# Patient Record
Sex: Male | Born: 1945 | Race: White | Marital: Married | Smoking: Former smoker
Health system: Southern US, Community
[De-identification: ages and names within clinical notes are randomized; demographics above are authoritative.]

## PROBLEM LIST (undated history)

## (undated) DIAGNOSIS — R0600 Dyspnea, unspecified: Secondary | ICD-10-CM

## (undated) DIAGNOSIS — R519 Headache, unspecified: Secondary | ICD-10-CM

## (undated) DIAGNOSIS — J45909 Unspecified asthma, uncomplicated: Secondary | ICD-10-CM

## (undated) DIAGNOSIS — E119 Type 2 diabetes mellitus without complications: Secondary | ICD-10-CM

## (undated) DIAGNOSIS — E039 Hypothyroidism, unspecified: Secondary | ICD-10-CM

## (undated) DIAGNOSIS — M1990 Unspecified osteoarthritis, unspecified site: Secondary | ICD-10-CM

## (undated) DIAGNOSIS — I10 Essential (primary) hypertension: Secondary | ICD-10-CM

## (undated) DIAGNOSIS — I639 Cerebral infarction, unspecified: Secondary | ICD-10-CM

## (undated) HISTORY — PX: TONSILLECTOMY: SUR1361

## (undated) HISTORY — PX: PITUITARY SURGERY: SHX203

---

## 2014-05-14 DEATH — deceased

## 2022-02-23 ENCOUNTER — Other Ambulatory Visit: Payer: Self-pay | Admitting: Physician Assistant

## 2022-02-23 ENCOUNTER — Ambulatory Visit
Admission: RE | Admit: 2022-02-23 | Discharge: 2022-02-23 | Disposition: A | Discharge status: Home / Self Care | Payer: Medicare Other | Source: Ambulatory Visit | Attending: Physician Assistant | Admitting: Physician Assistant

## 2022-02-23 DIAGNOSIS — M7989 Other specified soft tissue disorders: Secondary | ICD-10-CM | POA: Diagnosis present

## 2022-06-01 ENCOUNTER — Other Ambulatory Visit: Payer: Self-pay | Admitting: Family Medicine

## 2022-06-01 DIAGNOSIS — M542 Cervicalgia: Secondary | ICD-10-CM

## 2022-06-01 DIAGNOSIS — M5412 Radiculopathy, cervical region: Secondary | ICD-10-CM

## 2022-06-08 ENCOUNTER — Ambulatory Visit
Admission: RE | Admit: 2022-06-08 | Discharge: 2022-06-08 | Disposition: A | Discharge status: Home / Self Care | Payer: Medicare Other | Source: Ambulatory Visit | Attending: Family Medicine | Admitting: Family Medicine

## 2022-06-08 DIAGNOSIS — M5412 Radiculopathy, cervical region: Secondary | ICD-10-CM

## 2022-06-14 ENCOUNTER — Other Ambulatory Visit: Payer: Self-pay | Admitting: Family Medicine

## 2022-06-14 DIAGNOSIS — D352 Benign neoplasm of pituitary gland: Secondary | ICD-10-CM

## 2022-06-21 ENCOUNTER — Other Ambulatory Visit: Payer: Medicare Other

## 2022-06-22 ENCOUNTER — Ambulatory Visit
Admission: RE | Admit: 2022-06-22 | Discharge: 2022-06-22 | Disposition: A | Discharge status: Home / Self Care | Payer: Medicare Other | Source: Ambulatory Visit | Attending: Family Medicine | Admitting: Family Medicine

## 2022-06-22 DIAGNOSIS — D352 Benign neoplasm of pituitary gland: Secondary | ICD-10-CM | POA: Insufficient documentation

## 2022-06-22 MED ORDER — GADOBUTROL 1 MMOL/ML IV SOLN
9.0000 mL | Freq: Once | INTRAVENOUS | Status: AC | PRN
  Administered 2022-06-22: 9 mL via INTRAVENOUS

## 2022-08-03 ENCOUNTER — Other Ambulatory Visit: Payer: Self-pay

## 2022-08-03 ENCOUNTER — Encounter
Admission: RE | Disposition: A | Discharge status: Home / Self Care | Payer: Self-pay | Source: Home / Self Care | Attending: Internal Medicine

## 2022-08-03 ENCOUNTER — Encounter: Payer: Self-pay | Admitting: Internal Medicine

## 2022-08-03 ENCOUNTER — Ambulatory Visit
Admission: RE | Admit: 2022-08-03 | Discharge: 2022-08-03 | Disposition: A | Discharge status: Home / Self Care | Payer: Medicare Other | Attending: Internal Medicine | Admitting: Internal Medicine

## 2022-08-03 ENCOUNTER — Ambulatory Visit
Admission: RE | Admit: 2022-08-03 | Discharge: 2022-08-03 | Disposition: A | Discharge status: Home / Self Care | Payer: Medicare Other | Source: Home / Self Care | Attending: Internal Medicine | Admitting: Internal Medicine

## 2022-08-03 DIAGNOSIS — I639 Cerebral infarction, unspecified: Secondary | ICD-10-CM | POA: Diagnosis not present

## 2022-08-03 DIAGNOSIS — I059 Rheumatic mitral valve disease, unspecified: Secondary | ICD-10-CM

## 2022-08-03 HISTORY — DX: Unspecified osteoarthritis, unspecified site: M19.90

## 2022-08-03 HISTORY — PX: TEE WITHOUT CARDIOVERSION: SHX5443

## 2022-08-03 HISTORY — DX: Type 2 diabetes mellitus without complications: E11.9

## 2022-08-03 HISTORY — DX: Cerebral infarction, unspecified: I63.9

## 2022-08-03 HISTORY — DX: Unspecified asthma, uncomplicated: J45.909

## 2022-08-03 HISTORY — DX: Essential (primary) hypertension: I10

## 2022-08-03 HISTORY — DX: Headache, unspecified: R51.9

## 2022-08-03 HISTORY — DX: Dyspnea, unspecified: R06.00

## 2022-08-03 HISTORY — DX: Hypothyroidism, unspecified: E03.9

## 2022-08-03 SURGERY — ECHOCARDIOGRAM, TRANSESOPHAGEAL
Anesthesia: Moderate Sedation

## 2022-08-03 MED ORDER — SODIUM CHLORIDE 0.9 % IV SOLN: INTRAVENOUS | Status: DC

## 2022-08-03 MED ORDER — MIDAZOLAM HCL 2 MG/2ML IJ SOLN
INTRAMUSCULAR | Status: AC
  Filled 2022-08-03: qty 4

## 2022-08-03 MED ORDER — BUTAMBEN-TETRACAINE-BENZOCAINE 2-2-14 % EX AERO
INHALATION_SPRAY | CUTANEOUS | Status: AC
  Filled 2022-08-03: qty 5

## 2022-08-03 MED ORDER — SODIUM CHLORIDE FLUSH 0.9 % IV SOLN
INTRAVENOUS | Status: AC
  Filled 2022-08-03: qty 10

## 2022-08-03 MED ORDER — LIDOCAINE VISCOUS HCL 2 % MT SOLN
OROMUCOSAL | Status: AC
  Filled 2022-08-03: qty 15

## 2022-08-03 MED ORDER — FENTANYL CITRATE (PF) 100 MCG/2ML IJ SOLN
INTRAMUSCULAR | Status: AC
  Filled 2022-08-03: qty 2

## 2022-08-03 MED ORDER — FENTANYL CITRATE (PF) 100 MCG/2ML IJ SOLN
INTRAMUSCULAR | Status: AC | PRN
  Administered 2022-08-03: 50 ug via INTRAVENOUS

## 2022-08-03 MED ORDER — MIDAZOLAM HCL 2 MG/2ML IJ SOLN
INTRAMUSCULAR | Status: AC | PRN
  Administered 2022-08-03: 2 mg via INTRAVENOUS

## 2022-08-03 NOTE — CV Procedure (Signed)
Transesophageal echocardiogram preliminary report  Samuel Anthony [id number] 03/07/1946  Preliminary diagnosis  Stroke with possible embolic source  Postprocedural diagnosis  Normal LV systolic function no apparent source of embolus  Time out A timeout was performed by the nursing staff and physicians specifically identifying the procedure performed, identification of the patient, the type of sedation, all allergies and medications, all pertinent medical history, and presedation assessment of nasopharynx. The patient and or family understand the risks of the procedure including the rare risks of death, stroke, heart attack, esophogeal perforation, sore throat, and reaction to medications given.  Moderate sedation During this procedure the patient has received Versed 2 milligrams and fentanyl 50 micrograms to achieve appropriate moderate sedation.  The patient had continued monitoring of heart rate, oxygenation, blood pressure, respiratory rate, and extent of signs of sedation throughout the entire procedure.  The patient received this moderate sedation over a period of 12 minutes.  Both the nursing staff and I were present during the procedure when the patient had moderate sedation for 100% of the time.  Treatment considerations  No further cardiac intervention due to no apparent cardiac source of embolus  For further details of transesophageal echocardiogram please refer to final report.  Signed,  Corey Skains M.D. [facility] 08/03/2022 8:18 AM

## 2022-08-03 NOTE — Progress Notes (Signed)
*  PRELIMINARY RESULTS* Echocardiogram Echocardiogram Transesophageal has been performed.  Sherrie Sport 08/03/2022, 8:25 AM

## 2022-08-04 ENCOUNTER — Encounter: Payer: Self-pay | Admitting: Internal Medicine

## 2022-10-02 ENCOUNTER — Emergency Department: Payer: Medicare Other

## 2022-10-02 ENCOUNTER — Other Ambulatory Visit: Payer: Self-pay

## 2022-10-02 ENCOUNTER — Inpatient Hospital Stay
Admission: EM | Admit: 2022-10-02 | Discharge: 2022-10-05 | DRG: 193 | Disposition: A | Discharge status: Home / Self Care | Payer: Medicare Other | Attending: Internal Medicine | Admitting: Internal Medicine

## 2022-10-02 DIAGNOSIS — R0902 Hypoxemia: Principal | ICD-10-CM

## 2022-10-02 DIAGNOSIS — Z1152 Encounter for screening for COVID-19: Secondary | ICD-10-CM | POA: Diagnosis not present

## 2022-10-02 DIAGNOSIS — Z833 Family history of diabetes mellitus: Secondary | ICD-10-CM | POA: Diagnosis not present

## 2022-10-02 DIAGNOSIS — J210 Acute bronchiolitis due to respiratory syncytial virus: Secondary | ICD-10-CM | POA: Diagnosis present

## 2022-10-02 DIAGNOSIS — M109 Gout, unspecified: Secondary | ICD-10-CM | POA: Diagnosis present

## 2022-10-02 DIAGNOSIS — J9601 Acute respiratory failure with hypoxia: Secondary | ICD-10-CM | POA: Diagnosis present

## 2022-10-02 DIAGNOSIS — I10 Essential (primary) hypertension: Secondary | ICD-10-CM | POA: Diagnosis present

## 2022-10-02 DIAGNOSIS — J189 Pneumonia, unspecified organism: Secondary | ICD-10-CM | POA: Diagnosis not present

## 2022-10-02 DIAGNOSIS — Z8673 Personal history of transient ischemic attack (TIA), and cerebral infarction without residual deficits: Secondary | ICD-10-CM

## 2022-10-02 DIAGNOSIS — J45909 Unspecified asthma, uncomplicated: Secondary | ICD-10-CM | POA: Diagnosis present

## 2022-10-02 DIAGNOSIS — E871 Hypo-osmolality and hyponatremia: Secondary | ICD-10-CM | POA: Diagnosis present

## 2022-10-02 DIAGNOSIS — E039 Hypothyroidism, unspecified: Secondary | ICD-10-CM | POA: Diagnosis present

## 2022-10-02 DIAGNOSIS — R0602 Shortness of breath: Secondary | ICD-10-CM

## 2022-10-02 DIAGNOSIS — E1142 Type 2 diabetes mellitus with diabetic polyneuropathy: Secondary | ICD-10-CM | POA: Diagnosis present

## 2022-10-02 DIAGNOSIS — N179 Acute kidney failure, unspecified: Secondary | ICD-10-CM

## 2022-10-02 DIAGNOSIS — G4733 Obstructive sleep apnea (adult) (pediatric): Secondary | ICD-10-CM | POA: Diagnosis present

## 2022-10-02 DIAGNOSIS — J121 Respiratory syncytial virus pneumonia: Principal | ICD-10-CM | POA: Diagnosis present

## 2022-10-02 DIAGNOSIS — Z6828 Body mass index (BMI) 28.0-28.9, adult: Secondary | ICD-10-CM

## 2022-10-02 DIAGNOSIS — J45901 Unspecified asthma with (acute) exacerbation: Secondary | ICD-10-CM | POA: Diagnosis not present

## 2022-10-02 DIAGNOSIS — Z87891 Personal history of nicotine dependence: Secondary | ICD-10-CM | POA: Diagnosis not present

## 2022-10-02 DIAGNOSIS — J219 Acute bronchiolitis, unspecified: Secondary | ICD-10-CM

## 2022-10-02 DIAGNOSIS — E861 Hypovolemia: Secondary | ICD-10-CM | POA: Diagnosis present

## 2022-10-02 DIAGNOSIS — E663 Overweight: Secondary | ICD-10-CM | POA: Diagnosis present

## 2022-10-02 LAB — COMPREHENSIVE METABOLIC PANEL
ALT: 52 U/L — ABNORMAL HIGH (ref 0–44)
AST: 38 U/L (ref 15–41)
Albumin: 4.5 g/dL (ref 3.5–5.0)
Alkaline Phosphatase: 97 U/L (ref 38–126)
Anion gap: 11 (ref 5–15)
BUN: 36 mg/dL — ABNORMAL HIGH (ref 8–23)
CO2: 27 mmol/L (ref 22–32)
Calcium: 9.1 mg/dL (ref 8.9–10.3)
Chloride: 92 mmol/L — ABNORMAL LOW (ref 98–111)
Creatinine, Ser: 1.43 mg/dL — ABNORMAL HIGH (ref 0.61–1.24)
GFR, Estimated: 51 mL/min — ABNORMAL LOW (ref >60)
Glucose, Bld: 141 mg/dL — ABNORMAL HIGH (ref 70–99)
Potassium: 4.1 mmol/L (ref 3.5–5.1)
Sodium: 130 mmol/L — ABNORMAL LOW (ref 135–145)
Total Bilirubin: 1.1 mg/dL (ref 0.3–1.2)
Total Protein: 7.7 g/dL (ref 6.5–8.1)

## 2022-10-02 LAB — CBC WITH DIFFERENTIAL/PLATELET
Abs Immature Granulocytes: 0.12 10*3/uL — ABNORMAL HIGH (ref 0.00–0.07)
Basophils Absolute: 0.1 10*3/uL (ref 0.0–0.1)
Basophils Relative: 1 %
Eosinophils Absolute: 0.1 10*3/uL (ref 0.0–0.5)
Eosinophils Relative: 1 %
HCT: 44.8 % (ref 39.0–52.0)
Hemoglobin: 16.2 g/dL (ref 13.0–17.0)
Immature Granulocytes: 1 %
Lymphocytes Relative: 10 %
Lymphs Abs: 1 10*3/uL (ref 0.7–4.0)
MCH: 31.5 pg (ref 26.0–34.0)
MCHC: 36.2 g/dL — ABNORMAL HIGH (ref 30.0–36.0)
MCV: 87.2 fL (ref 80.0–100.0)
Monocytes Absolute: 0.5 10*3/uL (ref 0.1–1.0)
Monocytes Relative: 5 %
Neutro Abs: 8.3 10*3/uL — ABNORMAL HIGH (ref 1.7–7.7)
Neutrophils Relative %: 82 %
Platelets: 237 10*3/uL (ref 150–400)
RBC: 5.14 MIL/uL (ref 4.22–5.81)
RDW: 11.6 % (ref 11.5–15.5)
WBC: 10.1 10*3/uL (ref 4.0–10.5)
nRBC: 0 % (ref 0.0–0.2)

## 2022-10-02 LAB — LACTIC ACID, PLASMA: Lactic Acid, Venous: 0.9 mmol/L (ref 0.5–1.9)

## 2022-10-02 LAB — RESP PANEL BY RT-PCR (FLU A&B, COVID) ARPGX2
Influenza A by PCR: NEGATIVE
Influenza B by PCR: NEGATIVE
SARS Coronavirus 2 by RT PCR: NEGATIVE

## 2022-10-02 LAB — PROTIME-INR
INR: 1.1 (ref 0.8–1.2)
Prothrombin Time: 14.2 seconds (ref 11.4–15.2)

## 2022-10-02 MED ORDER — SODIUM CHLORIDE 0.9 % IV SOLN
2.0000 g | INTRAVENOUS | Status: DC
  Administered 2022-10-03: 2 g via INTRAVENOUS
  Filled 2022-10-02: qty 20

## 2022-10-02 MED ORDER — MAGNESIUM HYDROXIDE 400 MG/5ML PO SUSP: 30.0000 mL | Freq: Every day | ORAL | Status: DC | PRN

## 2022-10-02 MED ORDER — SODIUM CHLORIDE 0.9 % IV SOLN
500.0000 mg | INTRAVENOUS | Status: DC
  Administered 2022-10-03: 500 mg via INTRAVENOUS
  Filled 2022-10-02: qty 5

## 2022-10-02 MED ORDER — IOHEXOL 350 MG/ML SOLN
75.0000 mL | Freq: Once | INTRAVENOUS | Status: AC | PRN
  Administered 2022-10-02: 75 mL via INTRAVENOUS

## 2022-10-02 MED ORDER — ACETAMINOPHEN 650 MG RE SUPP: 650.0000 mg | Freq: Four times a day (QID) | RECTAL | Status: DC | PRN

## 2022-10-02 MED ORDER — HYDROCOD POLI-CHLORPHE POLI ER 10-8 MG/5ML PO SUER: 5.0000 mL | Freq: Two times a day (BID) | ORAL | Status: DC | PRN

## 2022-10-02 MED ORDER — PREDNISONE 20 MG PO TABS
40.0000 mg | ORAL_TABLET | Freq: Every day | ORAL | Status: DC
  Administered 2022-10-04 – 2022-10-05 (×2): 40 mg via ORAL
  Filled 2022-10-02 (×2): qty 2

## 2022-10-02 MED ORDER — IPRATROPIUM-ALBUTEROL 0.5-2.5 (3) MG/3ML IN SOLN
3.0000 mL | Freq: Once | RESPIRATORY_TRACT | Status: AC
  Administered 2022-10-02: 3 mL via RESPIRATORY_TRACT
  Filled 2022-10-02: qty 3

## 2022-10-02 MED ORDER — ADULT MULTIVITAMIN W/MINERALS CH
1.0000 | ORAL_TABLET | Freq: Every day | ORAL | Status: DC
  Administered 2022-10-03 – 2022-10-05 (×3): 1 via ORAL
  Filled 2022-10-02 (×3): qty 1

## 2022-10-02 MED ORDER — EMPAGLIFLOZIN 10 MG PO TABS
10.0000 mg | ORAL_TABLET | Freq: Every day | ORAL | Status: DC
  Administered 2022-10-03 – 2022-10-05 (×3): 10 mg via ORAL
  Filled 2022-10-02 (×3): qty 1

## 2022-10-02 MED ORDER — CLOPIDOGREL BISULFATE 75 MG PO TABS
75.0000 mg | ORAL_TABLET | Freq: Every day | ORAL | Status: DC
  Administered 2022-10-03 – 2022-10-05 (×3): 75 mg via ORAL
  Filled 2022-10-02 (×3): qty 1

## 2022-10-02 MED ORDER — AMLODIPINE BESYLATE 10 MG PO TABS
10.0000 mg | ORAL_TABLET | Freq: Every day | ORAL | Status: DC
  Administered 2022-10-03 – 2022-10-05 (×3): 10 mg via ORAL
  Filled 2022-10-02: qty 2
  Filled 2022-10-02 (×2): qty 1

## 2022-10-02 MED ORDER — ALBUTEROL SULFATE HFA 108 (90 BASE) MCG/ACT IN AERS: 1.0000 | INHALATION_SPRAY | Freq: Four times a day (QID) | RESPIRATORY_TRACT | Status: DC | PRN

## 2022-10-02 MED ORDER — CARVEDILOL 12.5 MG PO TABS
12.5000 mg | ORAL_TABLET | Freq: Two times a day (BID) | ORAL | Status: DC
  Administered 2022-10-03 – 2022-10-05 (×5): 12.5 mg via ORAL
  Filled 2022-10-02: qty 1
  Filled 2022-10-02: qty 2
  Filled 2022-10-02 (×3): qty 1

## 2022-10-02 MED ORDER — ONDANSETRON HCL 4 MG/2ML IJ SOLN: 4.0000 mg | Freq: Four times a day (QID) | INTRAMUSCULAR | Status: DC | PRN

## 2022-10-02 MED ORDER — ONDANSETRON HCL 4 MG PO TABS: 4.0000 mg | ORAL_TABLET | Freq: Four times a day (QID) | ORAL | Status: DC | PRN

## 2022-10-02 MED ORDER — LEVOTHYROXINE SODIUM 100 MCG PO TABS
100.0000 ug | ORAL_TABLET | Freq: Every day | ORAL | Status: DC
  Administered 2022-10-03 – 2022-10-05 (×3): 100 ug via ORAL
  Filled 2022-10-02 (×2): qty 1
  Filled 2022-10-02: qty 2
  Filled 2022-10-02: qty 1

## 2022-10-02 MED ORDER — BENZONATATE 100 MG PO CAPS: 200.0000 mg | ORAL_CAPSULE | ORAL | Status: DC | PRN

## 2022-10-02 MED ORDER — GUAIFENESIN ER 600 MG PO TB12
600.0000 mg | ORAL_TABLET | Freq: Two times a day (BID) | ORAL | Status: DC
  Administered 2022-10-03 – 2022-10-05 (×6): 600 mg via ORAL
  Filled 2022-10-02 (×6): qty 1

## 2022-10-02 MED ORDER — OMEGA-3-ACID ETHYL ESTERS 1 G PO CAPS
1.0000 | ORAL_CAPSULE | Freq: Every day | ORAL | Status: DC
  Administered 2022-10-03 – 2022-10-05 (×3): 1 g via ORAL
  Filled 2022-10-02 (×3): qty 1

## 2022-10-02 MED ORDER — ALFUZOSIN HCL ER 10 MG PO TB24
10.0000 mg | ORAL_TABLET | Freq: Every day | ORAL | Status: DC
  Administered 2022-10-03 – 2022-10-05 (×3): 10 mg via ORAL
  Filled 2022-10-02 (×3): qty 1

## 2022-10-02 MED ORDER — ACETAMINOPHEN 325 MG PO TABS: 650.0000 mg | ORAL_TABLET | Freq: Four times a day (QID) | ORAL | Status: DC | PRN

## 2022-10-02 MED ORDER — ACETYLCYSTEINE 20 % IN SOLN
4.0000 mL | Freq: Once | RESPIRATORY_TRACT | Status: AC
  Administered 2022-10-02: 4 mL via RESPIRATORY_TRACT
  Filled 2022-10-02: qty 4

## 2022-10-02 MED ORDER — METHYLPREDNISOLONE SODIUM SUCC 40 MG IJ SOLR
40.0000 mg | Freq: Two times a day (BID) | INTRAMUSCULAR | Status: AC
  Administered 2022-10-03 (×2): 40 mg via INTRAVENOUS
  Filled 2022-10-02 (×2): qty 1

## 2022-10-02 MED ORDER — EZETIMIBE 10 MG PO TABS
10.0000 mg | ORAL_TABLET | Freq: Every day | ORAL | Status: DC
  Administered 2022-10-03 – 2022-10-05 (×3): 10 mg via ORAL
  Filled 2022-10-02 (×3): qty 1

## 2022-10-02 MED ORDER — TRAZODONE HCL 50 MG PO TABS: 25.0000 mg | ORAL_TABLET | Freq: Every evening | ORAL | Status: DC | PRN

## 2022-10-02 MED ORDER — METHYLPREDNISOLONE SODIUM SUCC 125 MG IJ SOLR
125.0000 mg | Freq: Once | INTRAMUSCULAR | Status: AC
  Administered 2022-10-02: 125 mg via INTRAVENOUS
  Filled 2022-10-02: qty 2

## 2022-10-02 MED ORDER — ENOXAPARIN SODIUM 40 MG/0.4ML IJ SOSY
40.0000 mg | PREFILLED_SYRINGE | INTRAMUSCULAR | Status: DC
  Administered 2022-10-03 – 2022-10-05 (×3): 40 mg via SUBCUTANEOUS
  Filled 2022-10-02 (×3): qty 0.4

## 2022-10-02 MED ORDER — IPRATROPIUM-ALBUTEROL 0.5-2.5 (3) MG/3ML IN SOLN
3.0000 mL | Freq: Four times a day (QID) | RESPIRATORY_TRACT | Status: DC
  Administered 2022-10-03 (×2): 3 mL via RESPIRATORY_TRACT
  Filled 2022-10-02 (×2): qty 3

## 2022-10-02 MED ORDER — FLUTICASONE PROPIONATE 50 MCG/ACT NA SUSP: 2.0000 | Freq: Every day | NASAL | Status: DC | PRN

## 2022-10-02 MED ORDER — SODIUM CHLORIDE 0.9 % IV SOLN: INTRAVENOUS | Status: DC

## 2022-10-02 MED ORDER — LIOTHYRONINE SODIUM 5 MCG PO TABS
5.0000 ug | ORAL_TABLET | Freq: Every day | ORAL | Status: DC
  Administered 2022-10-03 – 2022-10-05 (×3): 5 ug via ORAL
  Filled 2022-10-02 (×3): qty 1

## 2022-10-02 MED ORDER — ROSUVASTATIN CALCIUM 10 MG PO TABS
5.0000 mg | ORAL_TABLET | ORAL | Status: DC
  Administered 2022-10-03 – 2022-10-05 (×2): 5 mg via ORAL
  Filled 2022-10-02 (×3): qty 1

## 2022-10-02 MED ORDER — TURMERIC 500 MG PO CAPS: 350.0000 mg | ORAL_CAPSULE | ORAL | Status: DC

## 2022-10-02 NOTE — ED Provider Notes (Signed)
[facility] Provider Note    Event Date/Time   First MD Initiated Contact with Patient 10/02/22 1914     (approximate)   History   Shortness of Breath   HPI  Samuel Anthony is a 76 y.o. male who presents to the emergency department today because of concerns for shortness of breath.  Much of the history is obtained from wife at bedside.  She states about a week ago [place]  The patient apparently had x-ray done at that time.  Had been placed on multiple antibiotics.  However has not had any significant improvement.  Wife has been monitoring his oxygen levels at home and they have been going below 90. Patient feels like he has a lot of mucus in his chest that he is having a hard time bringing up. No fevers.      Physical Exam   Triage Vital Signs: ED Triage Vitals  Enc Vitals Group     BP 10/02/22 1917 (!) 173/76     Pulse Rate 10/02/22 1917 84     Resp 10/02/22 1917 20     Temp 10/02/22 1917 97.7 F (36.5 C)     Temp Source 10/02/22 1917 Oral     SpO2 10/02/22 1916 (!) 78 %     Weight 10/02/22 1915 195 lb (88.5 kg)     Height 10/02/22 1915 '5\' 9"'$  (1.753 m)     Head Circumference --      Peak Flow --      Pain Score 10/02/22 1916 0     Pain Loc --      Pain Edu? --      Excl. in [place]? --     Most recent vital signs: Vitals:   10/02/22 1916 10/02/22 1917  BP:  (!) 173/76  Pulse:  84  Resp:  20  Temp:  97.7 F (36.5 C)  SpO2: (!) 78% 93%   General: Awake, alert, oriented. CV:  Good peripheral perfusion. Regular rate and rhythm. Resp:  Increased work of breathing. Diffuse wheezing and rhonchi. Abd:  No distention.     ED Results / Procedures / Treatments   Labs (all labs ordered are listed, but only abnormal results are displayed) Labs Reviewed  COMPREHENSIVE METABOLIC PANEL - Abnormal; Notable for the following components:      Result Value   Sodium 130 (*)    Chloride 92  (*)    Glucose, Bld 141 (*)    BUN 36 (*)    Creatinine, Ser 1.43 (*)    ALT 52 (*)    GFR, Estimated 51 (*)    All other components within normal limits  CBC WITH DIFFERENTIAL/PLATELET - Abnormal; Notable for the following components:   MCHC 36.2 (*)    Neutro Abs 8.3 (*)    Abs Immature Granulocytes 0.12 (*)    All other components within normal limits  RESP PANEL BY RT-PCR (FLU A&B, COVID) ARPGX2  CULTURE, BLOOD (ROUTINE X 2)  CULTURE, BLOOD (ROUTINE X 2)  LACTIC ACID, PLASMA  PROTIME-INR  LACTIC ACID, PLASMA  URINALYSIS, ROUTINE W REFLEX MICROSCOPIC    RADIOLOGY I independently interpreted and visualized the CXR. My interpretation: No pneumonia.  Radiology interpretation:  IMPRESSION:  No active disease.   I independently interpreted and visualized the CT angio PE. My interpretation: No large PE Radiology interpretation:  IMPRESSION:  1. No evidence for pulmonary embolism.  2.  Bilateral lower lobe peribronchial wall thickening compatible  with bronchitis/bronchiolitis.  3. 6 mm rounded density in the proximal right lower lobe bronchus.  This may represent mucous plugging, however, endobronchial lesion  cannot be excluded.  4. Small amount of atelectasis in the left lower lobe with left  lower lobe mucous plugging.      PROCEDURES:  Critical Care performed: Yes  Procedures  CRITICAL CARE Performed by: Nance Pear   Total critical care time: 30 minutes  Critical care time was exclusive of separately billable procedures and treating other patients.  Critical care was necessary to treat or prevent imminent or life-threatening deterioration.  Critical care was time spent personally by me on the following activities: development of treatment plan with patient and/or surrogate as well as nursing, discussions with consultants, evaluation of patient's response to treatment, examination of patient, obtaining history from patient or surrogate, ordering and  performing treatments and interventions, ordering and review of laboratory studies, ordering and review of radiographic studies, pulse oximetry and re-evaluation of patient's condition.   MEDICATIONS ORDERED IN ED: Medications - No data to display   IMPRESSION / MDM / [place] / ED COURSE  I reviewed the triage vital signs and the nursing notes.                              Differential diagnosis includes, but is not limited to, pneumonia, PTX, covid, PE.  Patient's presentation is most consistent with acute presentation with potential threat to life or bodily function.  Patient presented to the emergency department today because of concerns for continued shortness of breath.  Patient was found by hypoxic here.  Patient is not on any oxygen at home.  Chest x-ray did not show any evidence of pneumonia and he is currently continuing treatment for pneumonia.  COVID and influenza were negative.  Did obtain a CT angio PE to evaluate for pulmonary embolus.  This did not show any PE.  Did not show any pneumonia.  Did question mucous plug.  Did order patient Mucomyst as well as DuoNeb treatments.  Patient was given Solu-Medrol.  Discussed findings with patient family.  I do feel patient would benefit from further work-up and evaluation.  Discussed with Dr. Sidney Ace with the hospitalist service who will plan on admission.  FINAL CLINICAL IMPRESSION(S) / ED DIAGNOSES   Final diagnoses:  Hypoxia  SOB (shortness of breath)     Note:  This document was prepared using Dragon voice recognition software and may include unintentional dictation errors.    Nance Pear, MD 10/02/22 [phone]

## 2022-10-02 NOTE — ED Triage Notes (Addendum)
Pt arrived via POV with reports of shortness of breath dx with PNA 6 days ago. Pt states worsening shortness of breath, pt unable to speak in complete sentences without running out of breath. Pt states his oxygen levels have been low most of the day.   Pt was taking augmentin and zpak, nebs and tessalon perles.

## 2022-10-02 NOTE — H&P (Incomplete)
PATIENT NAME: Samuel Anthony    MR#:  [id number]  DATE OF BIRTH:  04-09-1946  DATE OF ADMISSION:  10/02/2022  PRIMARY CARE PHYSICIAN: Kirk Ruths, MD   Patient is coming from: Home  REQUESTING/REFERRING PHYSICIAN: Nance Pear, MD  CHIEF COMPLAINT:   Chief Complaint  Patient presents with   Shortness of Breath    HISTORY OF PRESENT ILLNESS:  Samuel Anthony is a 76 y.o. pleasant Caucasian male with medical history significant for asthma, type diabetes mellitus, hypertension, CVA and hypothyroidism, presented to the ER with worsening dyspnea with associated congested cough with inability to expectorate as well as occasional wheezing that [place] a [place].  He was seen there and diagnosed with pneumonia for which she was given Z-Pak and Augmentin.  He finished Z-Pak and continues to take Augmentin.  He was noted to have hypoxia when his wife checked his pulse oximetry today and found it 81 to 89% on room air.  He does not use oxygen at home.  He was down to the 70s in the ER on room air with mild exertion.  He admitted to chills but did not have any measured fever.  No chest pain or palpitations.  No dysuria, oliguria or hematuria or flank pain.  No nausea or vomiting or abdominal pain.  No rhinorrhea nasal congestion or sore throat or earache.  No recent COVID 19 exposure.  ED Course: When he came to the ER, BP was 173/76 and pulse symmetry was 78% on room air and 93% on 6 L of O2 by nasal cannula.  Labs revealed hyponatremia 130 and hypochloremia of 92, BUN of 36 and creatinine 1.43 with AST T of 38 and ALT 52.  CBC was unremarkable.  Influenza antigens and COVID-19 PCR came back negative.  2 blood cultures were drawn.  Imaging: Portable chest x-ray showed no active disease. Chest CTA revealed no evidence for PE.  Showed bilateral lower lobe peribronchial wall thickening compatible with bronchitis/bronchiolitis.  It  also showed  6 mm rounded density in the proximal right lower lobe bronchus, which may represent mucous plugging, however, endobronchial lesion cannot be excluded. It also showed Small amount of atelectasis in the left lower lobe with left lower lobe mucous plugging.  The patient was given 125 mg IV Solu-Medrol, Mucomyst nebulizer and DuoNebs twice.  He will be admitted to an medical telemetry bed for further evaluation and management. PAST MEDICAL HISTORY:   Past Medical History:  Diagnosis Date   Arthritis    Asthma    Diabetes mellitus without complication ([place])    Dyspnea    Headache    Hypertension    Hypothyroidism    Stroke 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 - Tonsillectomy  SOCIAL HISTORY:   Social History   Tobacco Use   Smoking status: Former    Types: Cigarettes   Smokeless tobacco: Never  Substance Use Topics   Alcohol use: Yes    Alcohol/week: 7.0 standard drinks of alcohol    Types: 7 Shots of liquor per week    FAMILY HISTORY:  Positive for renal cancer and diabetes mellitus, dementia and PE as well as MI.  DRUG ALLERGIES:   Allergies  Allergen Reactions   Codeine Nausea Only and Other (See Comments)  Oxycodone-Acetaminophen Nausea Only   Atorvastatin Other (See Comments)    Other Reaction: OTHER REACTION MUSCLE PAIN AND   Fenofibrate Micronized Other (See Comments)    Other Reaction: OTHER REACTION: MUSCLE PAIN   Levofloxacin Other (See Comments)   Tricor [Fenofibrate]     REVIEW OF SYSTEMS:   ROS As per history of present illness. All pertinent systems were reviewed above. Constitutional, HEENT, cardiovascular, respiratory, GI, GU, musculoskeletal, neuro, psychiatric, endocrine, integumentary and hematologic systems were reviewed and are otherwise negative/unremarkable except for positive findings mentioned above in  the HPI.   MEDICATIONS AT HOME:   Prior to Admission medications   Medication Sig Start Date End Date Taking? Authorizing Provider  albuterol (VENTOLIN HFA) 108 (90 Base) MCG/ACT inhaler Inhale 1 puff into the lungs every 6 (six) hours as needed for wheezing or shortness of breath.   Yes [provider]  alfuzosin (UROXATRAL) 10 MG 24 hr tablet Take 10 mg by mouth daily with breakfast.   Yes [provider]  amLODipine (NORVASC) 10 MG tablet Take 1 tablet by mouth daily. 09/02/22  Yes [provider]  amoxicillin-clavulanate (AUGMENTIN) 875-125 MG tablet Take 1 tablet by mouth 2 (two) times daily. 09/27/22  Yes [provider]  azithromycin (ZITHROMAX) 250 MG tablet Take 250 mg by mouth daily. 09/27/22  Yes [provider]  benzonatate (TESSALON) 200 MG capsule Take by mouth. 09/27/22 10/07/22 Yes [provider]  carvedilol (COREG) 12.5 MG tablet Take 12.5 mg by mouth 2 (two) times daily with a meal. 08/12/22 08/12/23 Yes [provider]  clopidogrel (PLAVIX) 75 MG tablet Take 75 mg by mouth daily.   Yes [provider]  ezetimibe (ZETIA) 10 MG tablet Take 10 mg by mouth daily.   Yes [provider]  fluticasone (FLONASE) 50 MCG/ACT nasal spray Place into both nostrils daily.   Yes [provider]  Homeopathic Products (FRANKINCENSE UPLIFTING) OIL Inhale 1 Tube into the lungs 1 day or 1 dose.   Yes [provider]  irbesartan-hydrochlorothiazide (AVALIDE) 300-12.5 MG tablet Take 1 tablet by mouth daily.   Yes [provider]  JARDIANCE 10 MG TABS tablet Take 10 mg by mouth daily.   Yes [provider]  Javier Docker Oil 350 MG CAPS Take 1 tablet by mouth 1 day or 1 dose.   Yes [provider]  levothyroxine (SYNTHROID) 100 MCG tablet Take 100 mcg by mouth daily before breakfast.   Yes [provider]  liothyronine (CYTOMEL) 5 MCG tablet Take 5 mcg by mouth daily.   Yes  [provider]  Multiple Vitamin (MULTIVITAMIN) capsule Take 1 capsule by mouth daily.   Yes [provider]  NON FORMULARY Blue mag   Yes [provider]  rosuvastatin (CRESTOR) 5 MG tablet Take 5 mg by mouth 3 (three) times a week.   Yes [provider]  Turmeric (QC TUMERIC COMPLEX PO) Take 350 mg by mouth 1 day or 1 dose.   Yes [provider]  albuterol (ACCUNEB) 0.63 MG/3ML nebulizer solution Take 1 ampule by nebulization every 4 (four) hours as needed for wheezing. Patient not taking: Reported on 08/03/2022    [provider]      VITAL SIGNS:  Blood pressure (!) 114/58, pulse 69, temperature 97.7 F (36.5 C), temperature source Oral, resp. rate (!) 26, height '5\' 9"'$  (1.753 m), weight 88.5 kg, SpO2 98 %.  PHYSICAL EXAMINATION:  Physical Exam  GENERAL:  76 y.o.-year-old, patient male patient lying  in the bed with no acute distress.  EYES: Pupils equal, round, reactive to light and accommodation. No scleral icterus. Extraocular muscles intact.  HEENT: Head atraumatic, normocephalic. Oropharynx and nasopharynx clear.  NECK:  Supple, no jugular venous distention. No thyroid enlargement, no tenderness.  LUNGS: Diminished bibasal breath sounds with associated crackles as well as diminished extremity airflow with expiratory wheezes and occasional inspiratory and expiratory rhonchi.  No use of accessory muscles of respiration.  CARDIOVASCULAR: Regular rate and rhythm, S1, S2 normal. No murmurs, rubs, or gallops.  ABDOMEN: Soft, nondistended, nontender. Bowel sounds present. No organomegaly or mass.  EXTREMITIES: No pedal edema, cyanosis, or clubbing.  NEUROLOGIC: Cranial nerves II through XII are intact. Muscle strength 5/5 in all extremities. Sensation intact. Gait not checked.  PSYCHIATRIC: The patient is alert and oriented x 3.  Normal affect and good eye contact. SKIN: No obvious rash, lesion, or ulcer.   LABORATORY PANEL:    CBC Recent Labs  Lab 10/02/22 2027  WBC 10.1  HGB 16.2  HCT 44.8  PLT 237   ------------------------------------------------------------------------------------------------------------------  Chemistries  Recent Labs  Lab 10/02/22 2027  NA 130*  K 4.1  CL 92*  CO2 27  GLUCOSE 141*  BUN 36*  CREATININE 1.43*  CALCIUM 9.1  AST 38  ALT 52*  ALKPHOS 97  BILITOT 1.1   ------------------------------------------------------------------------------------------------------------------  Cardiac Enzymes No results for input(s): "TROPONINI" in the last 168 hours. ------------------------------------------------------------------------------------------------------------------  RADIOLOGY:  CT Angio Chest PE W and/or Wo Contrast  Result Date: 10/02/2022 CLINICAL DATA:  Hypoxia and shortness of breath. EXAM: CT ANGIOGRAPHY CHEST WITH CONTRAST TECHNIQUE: Multidetector CT imaging of the chest was performed using the standard protocol during bolus administration of intravenous contrast. Multiplanar CT image reconstructions and MIPs were obtained to evaluate the vascular anatomy. RADIATION DOSE REDUCTION: This exam was performed according to the departmental dose-optimization program which includes automated exposure control, adjustment of the mA and/or kV according to patient size and/or use of iterative reconstruction technique. CONTRAST:  27m OMNIPAQUE IOHEXOL 350 MG/ML SOLN COMPARISON:  None Available. FINDINGS: Cardiovascular: Satisfactory opacification of the pulmonary arteries to the segmental level. No evidence of pulmonary embolism. Normal heart size. No pericardial effusion. Mediastinum/Nodes: No enlarged mediastinal, hilar, or axillary lymph nodes. Thyroid gland, trachea, and esophagus demonstrate no significant findings. Lungs/Pleura: There is bilateral lower lobe peribronchial wall thickening. There is a small amount of atelectasis in the left lower lobe. There is a rounded  density within the proximal right lower lobe bronchus measuring 6 mm coronal image 7/84. There is also some plugging of more peripheral left lower lobe bronchi. Trachea is patent. No pleural effusion or pneumothorax. Upper Abdomen: No acute abnormality. Musculoskeletal: No chest wall abnormality. No acute or significant osseous findings. Review of the MIP images confirms the above findings. IMPRESSION: 1. No evidence for pulmonary embolism. 2. Bilateral lower lobe peribronchial wall thickening compatible with bronchitis/bronchiolitis. 3. 6 mm rounded density in the proximal right lower lobe bronchus. This may represent mucous plugging, however, endobronchial lesion cannot be excluded. 4. Small amount of atelectasis in the left lower lobe with left lower lobe mucous plugging. Electronically Signed   By: ARonney AstersM.D.   On: 10/02/2022 22:52   DG Chest Portable 1 View  Result Date: 10/02/2022 CLINICAL DATA:  Shortness of breath EXAM: PORTABLE CHEST 1 VIEW COMPARISON:  None Available. FINDINGS: The heart size and mediastinal contours are within normal limits. Both lungs are clear. The visualized skeletal structures are unremarkable. IMPRESSION: No active disease.  Electronically Signed   By: Ronney Asters M.D.   On: 10/02/2022 19:46      IMPRESSION AND PLAN:  Assessment and Plan: * Acute respiratory failure with hypoxia ([place]) - This likely secondary to underlying recent community-acquired pneumonia as well as acute bronchiolitis and bronchitis with acute asthma exacerbation. - The patient be admitted to a medical telemetry bed. - We will continue antibiotic therapy with IV Rocephin and Zithromax. - We will place him on steroid therapy with IV Solu-Medrol as well as bronchodilator therapy with DuoNebs 4 times daily and every 4 hours as needed in addition to mucolytic therapy. - We will hold off Trelegy. - We will follow blood cultures and obtain sputum culture. - O2 protocol will be followed.  AKI  (acute kidney injury) ([place])  - The patient be hydrated with IV normal saline - We will follow BMP and avoid nephrotoxins.  Type 2 diabetes mellitus with peripheral neuropathy (H[facility]) - We will continue Jardiance and Neurontin. - The patient will be placed on supplement coverage with NovoLog.  Hypothyroidism - We will resume synthroid and cytomel.  Gout Will continue allopurinol.  Essential hypertension - We will continue his antihypertensives while holding off nephrotoxins.  Hyponatremia - This is likely hypovolemic. - We will continue hydration with IV normal saline and follow sodium level as mentioned above.    DVT prophylaxis: Lovenox.  Advanced Care Planning:  Code Status: full code.  Family Communication:  The plan of care was discussed in details with the patient (and family). I answered all questions. The patient agreed to proceed with the above mentioned plan. Further management will depend upon hospital course. Disposition Plan: Back to previous home environment Consults called: none.  All the records are reviewed and case discussed with ED provider.  Status is: Inpatient   At the time of the admission, it appears that the appropriate admission status for this patient is inpatient.  This is judged to be reasonable and necessary in order to provide the required intensity of service to ensure the patient's safety given the presenting symptoms, physical exam findings and initial radiographic and laboratory data in the context of comorbid conditions.  The patient requires inpatient status due to high intensity of service, high risk of further deterioration and high frequency of surveillance required.  I certify that at the time of admission, it is my clinical judgment that the patient will require inpatient hospital care extending more than 2 midnights.                            Dispo: The patient is from: Home              Anticipated d/c is to: Home              Patient  currently is not medically stable to d/c.              Difficult to place patient: No  Christel Mormon M.D on 10/03/2022 at 2:00 AM  [facility]   From 7 PM-7 AM, contact night-coverage [email or web address]  CC: Primary care physician; Kirk Ruths, MD

## 2022-10-03 ENCOUNTER — Encounter: Payer: Self-pay | Admitting: Family Medicine

## 2022-10-03 DIAGNOSIS — J219 Acute bronchiolitis, unspecified: Secondary | ICD-10-CM | POA: Diagnosis present

## 2022-10-03 DIAGNOSIS — N179 Acute kidney failure, unspecified: Secondary | ICD-10-CM | POA: Diagnosis present

## 2022-10-03 DIAGNOSIS — M109 Gout, unspecified: Secondary | ICD-10-CM | POA: Insufficient documentation

## 2022-10-03 DIAGNOSIS — E871 Hypo-osmolality and hyponatremia: Secondary | ICD-10-CM | POA: Diagnosis present

## 2022-10-03 DIAGNOSIS — E1142 Type 2 diabetes mellitus with diabetic polyneuropathy: Secondary | ICD-10-CM | POA: Diagnosis present

## 2022-10-03 DIAGNOSIS — E039 Hypothyroidism, unspecified: Secondary | ICD-10-CM | POA: Diagnosis present

## 2022-10-03 DIAGNOSIS — I10 Essential (primary) hypertension: Secondary | ICD-10-CM | POA: Diagnosis present

## 2022-10-03 DIAGNOSIS — E663 Overweight: Secondary | ICD-10-CM

## 2022-10-03 DIAGNOSIS — G4733 Obstructive sleep apnea (adult) (pediatric): Secondary | ICD-10-CM

## 2022-10-03 DIAGNOSIS — J45901 Unspecified asthma with (acute) exacerbation: Secondary | ICD-10-CM | POA: Diagnosis present

## 2022-10-03 LAB — RESPIRATORY PANEL BY PCR

## 2022-10-03 LAB — URINALYSIS, ROUTINE W REFLEX MICROSCOPIC
Bacteria, UA: NONE SEEN
Bilirubin Urine: NEGATIVE
Glucose, UA: >=500 mg/dL — AB
Hgb urine dipstick: NEGATIVE
Ketones, ur: 5 mg/dL — AB
Leukocytes,Ua: NEGATIVE
Nitrite: NEGATIVE
Protein, ur: 30 mg/dL — AB
Specific Gravity, Urine: 1.025 (ref 1.005–1.030)
Squamous Epithelial / HPF: NONE SEEN (ref 0–5)
pH: 5 (ref 5.0–8.0)

## 2022-10-03 LAB — BRAIN NATRIURETIC PEPTIDE: B Natriuretic Peptide: 28.2 pg/mL (ref 0.0–100.0)

## 2022-10-03 LAB — BASIC METABOLIC PANEL
Anion gap: 12 (ref 5–15)
BUN: 35 mg/dL — ABNORMAL HIGH (ref 8–23)
CO2: 26 mmol/L (ref 22–32)
Calcium: 8.9 mg/dL (ref 8.9–10.3)
Chloride: 91 mmol/L — ABNORMAL LOW (ref 98–111)
Creatinine, Ser: 1.56 mg/dL — ABNORMAL HIGH (ref 0.61–1.24)
GFR, Estimated: 46 mL/min — ABNORMAL LOW (ref >60)
Glucose, Bld: 179 mg/dL — ABNORMAL HIGH (ref 70–99)
Potassium: 4.4 mmol/L (ref 3.5–5.1)
Sodium: 129 mmol/L — ABNORMAL LOW (ref 135–145)

## 2022-10-03 LAB — LACTIC ACID, PLASMA: Lactic Acid, Venous: 1.3 mmol/L (ref 0.5–1.9)

## 2022-10-03 LAB — CBC
HCT: 45.5 % (ref 39.0–52.0)
Hemoglobin: 16.3 g/dL (ref 13.0–17.0)
MCH: 31.5 pg (ref 26.0–34.0)
MCHC: 35.8 g/dL (ref 30.0–36.0)
MCV: 87.8 fL (ref 80.0–100.0)
Platelets: 218 10*3/uL (ref 150–400)
RBC: 5.18 MIL/uL (ref 4.22–5.81)
RDW: 11.6 % (ref 11.5–15.5)
WBC: 10.1 10*3/uL (ref 4.0–10.5)
nRBC: 0 % (ref 0.0–0.2)

## 2022-10-03 LAB — PROCALCITONIN: Procalcitonin: <0.1 ng/mL

## 2022-10-03 MED ORDER — IPRATROPIUM-ALBUTEROL 0.5-2.5 (3) MG/3ML IN SOLN
3.0000 mL | Freq: Two times a day (BID) | RESPIRATORY_TRACT | Status: DC
  Administered 2022-10-03 – 2022-10-05 (×3): 3 mL via RESPIRATORY_TRACT
  Filled 2022-10-03 (×3): qty 3

## 2022-10-03 NOTE — Progress Notes (Signed)
[facility] Progress Note  Patient: Samuel Anthony    IEP:[id number]  DOA: 10/02/2022    Date of Service: the patient was seen and examined on 10/03/2022  Brief hospital course: 76 year old male with past medical history of asthma, diabetes mellitus, hypertension, and previous CVA who presented to the emergency room on 11/19 with cough and shortness of breath x1 week.  When symptoms started, patient was on vacation and seen at an urgent care where he was diagnosed with pneumonia and given Augmentin and a Z-Pak.  Patient has completed course of the latter and continues to take Augmentin, but presented to the emergency room when wife checked pulse ox and found oxygen levels to be 81 to 89% on room air (patient not normally on oxygen).  In the emergency room, found to have oxygen saturations in the 70s with ambulation.  COVID and flu titers negative.  CT scan noted no evidence of PE, but did note bilateral lower lobe peribronchial wall thickening compatible with bronchitis as well as a 6 mm proximal right lower lobe density, felt to likely be mucous plug.  Patient mated to the hospitalist service and started on IV antibiotics plus steroids and fluids.  Assessment and Plan: Assessment and Plan: * Acute respiratory failure with hypoxia ([place]) Unclear etiology.  May be an acute bronchiolitis.  Lung exam is clear with no wheezing.  Procalcitonin normal and COVID, flu and PE ruled out.  We will plan to continue nebulizers and steroids and mucolytic therapy.  Discontinue antibiotics given normal procalcitonin.  Work to wean off of oxygen.  Respiratory panel pending.  AKI (acute kidney injury) ([place]) Creatinine currently 1.53 with GFR 46.  No known previous history of renal disease.  Continue fluid resuscitation.  May be in part due to recent illness with poor p.o. intake.  Hyponatremia - This is likely hypovolemic. - We will continue hydration with IV normal saline and follow sodium level as  mentioned above.  Follow-up labs in the morning.  Obstructive sleep apnea Not always compliant with CPAP.  Encouraged patient to do so.  Wife is bringing in patient's machine from home.  Essential hypertension - We will continue his antihypertensives while holding off nephrotoxins.  Type 2 diabetes mellitus with peripheral neuropathy (HCC) - We will continue Jardiance and Neurontin. - The patient will be placed on supplement coverage with NovoLog.  Hypothyroidism - We will resume synthroid and cytomel.  Overweight (BMI 25.0-29.9) Meets criteria BMI greater than 25  Gout Will continue allopurinol.       Body mass index is 28.8 kg/m.        Consultants: None  Procedures: None   Antimicrobials: IV Rocephin and Zithromax 11/19 - 11/20  Code Status: Full code   Subjective: Patient feels okay, breathing is a little bit easier.  Not able to cough up any sputum  Objective: Vital signs were reviewed and unremarkable. Vitals:   10/03/22 0945 10/03/22 1310  BP:  138/62  Pulse: 65 68  Resp: 12 13  Temp:  98 F (36.7 C)  SpO2: 99% 95%    Intake/Output Summary (Last 24 hours) at 10/03/2022 1445 Last data filed at 10/03/2022 0300 Gross per 24 hour  Intake 250 ml  Output --  Net 250 ml   Filed Weights   10/02/22 1915  Weight: 88.5 kg   Body mass index is 28.8 kg/m.  Exam:  General: Alert and oriented x3, no acute distress HEENT: Normocephalic, atraumatic, mucous membranes are moist Cardiovascular: Regular rate and  rhythm, S1-S2 Respiratory: Clear to auscultation bilaterally, no wheezing Abdomen: Abdomen soft, nontender, nondistended, positive bowel sounds Musculoskeletal: No clubbing or cyanosis or edema Skin: No skin breaks, tears or lesions Psychiatry: Appropriate, no evidence of psychoses Neurology: No focal deficits  Data Reviewed: BNP of 28, procalcitonin level less than 0.1.  Sodium at 129 creatinine at 1.56.  Disposition:  Status is:  Inpatient Remains inpatient appropriate because:  -Improvement in hypoxia -Results of respiratory panel    Anticipated discharge date: 11/21  Family Communication: Wife at the bedside DVT Prophylaxis: enoxaparin (LOVENOX) injection 40 mg Start: 10/03/22 0800    Author: Annita Brod ,MD 10/03/2022 2:45 PM  To reach On-call, see care teams to locate the attending and reach out via [email or web address]. Between 7PM-7AM, please contact night-coverage If you still have difficulty reaching the attending provider, please page the [facility] (Director on Call) for [facility] on amion for assistance.

## 2022-10-03 NOTE — Assessment & Plan Note (Addendum)
Creatinine peaked at 1.53 with GFR 46.  No known previous history of renal disease.  Responding to IV fluids and creatinine at 1.29 today.

## 2022-10-03 NOTE — Assessment & Plan Note (Signed)
-   We will resume synthroid and cytomel.

## 2022-10-03 NOTE — Progress Notes (Signed)
PHARMACIST - PHYSICIAN ORDER COMMUNICATION  CONCERNING: P&T Medication Policy on Herbal Medications  DESCRIPTION:  This patient's order(s) for: Tumeric has been noted.  This product(s) is classified as an "herbal" or natural product. Due to a lack of definitive safety studies or FDA approval, nonstandard manufacturing practices, plus the potential risk of unknown drug-drug interactions while on inpatient medications, the Pharmacy and Therapeutics Committee does not permit the use of "herbal" or natural products of this type within [facility].   ACTION TAKEN: The pharmacy department is unable to verify this order at this time.  Please reevaluate patient's clinical condition at discharge and address if the herbal or natural product(s) should be resumed at that time.  Renda Rolls, PharmD, [employer] 10/03/2022 12:17 AM

## 2022-10-03 NOTE — ED Notes (Signed)
Pt titrated to 4L [place] and tolerating well.

## 2022-10-03 NOTE — Assessment & Plan Note (Signed)
Not always compliant with CPAP.  Encouraged patient to do so.  Wife is bringing in patient's machine from home.

## 2022-10-03 NOTE — Assessment & Plan Note (Signed)
Meets criteria BMI greater than 25 

## 2022-10-03 NOTE — Evaluation (Signed)
Physical Therapy Evaluation Patient Details Name: Samuel Anthony MRN: [id number] DOB: [birth year] Today's Date: 10/03/2022  History of Present Illness  76 y.o. pleasant Caucasian male with medical history significant for asthma, type diabetes mellitus, hypertension, CVA and hypothyroidism, presented to the ER with worsening dyspnea with associated congested cough with inability to expectorate as well as occasional wheezing that started about a week ago when he was at the beach.  He was seen there and diagnosed with pneumonia for which he was given Z-Pak and Augmentin. He had hypoxia when his wife checked his pulse oximetry on 10/02/22 and found it 81 to 89% on room air.  He does not use oxygen at home.  Clinical Impression  Pt is a pleasant 76 year old male who was admitted for ARF with hypoxia and PNA. Pt performs bed mobility/transfers with mod I and ambulation with supervision and no AD. All mobility performed on 3L of O2 with sats dipping to 88% with exertion. Pt demonstrates deficits with energy conservation and activity tolerance. Would benefit from skilled PT to address above deficits and promote optimal return to PLOF. Will keep on caseload throughout acute admission to prevent further deconditioning. Not recommending further PT services at this time.      Recommendations for follow up therapy are one component of a multi-disciplinary discharge planning process, led by the attending physician.  Recommendations may be updated based on patient status, additional functional criteria and insurance authorization.  Follow Up Recommendations No PT follow up      Assistance Recommended at Discharge PRN  Patient can return home with the following  A little help with walking and/or transfers;Help with stairs or ramp for entrance    Equipment Recommendations None recommended by PT  Recommendations for Other Services       Functional Status Assessment Patient has not had a recent decline in  their functional status     Precautions / Restrictions Precautions Precautions: Fall Restrictions Weight Bearing Restrictions: No      Mobility  Bed Mobility Overal bed mobility: Modified Independent             General bed mobility comments: safe technique with ease of mobility    Transfers Overall transfer level: Modified independent Equipment used: None               General transfer comment: upright posture. All mobility performed on 3L of O2. Upright posture noted    Ambulation/Gait Ambulation/Gait assistance: Supervision Gait Distance (Feet): 150 Feet Assistive device: None Gait Pattern/deviations: Step-through pattern       General Gait Details: ambulated with wide BOS and ER of L hip. No pain reported. No unsteadiness, however does demonstate wheezing with increased distances. All mobility performed on 3L of O2 with sats at 88% with exertion. Quick recovery once seated  Stairs            Wheelchair Mobility    Modified Rankin (Stroke Patients Only)       Balance Overall balance assessment: No apparent balance deficits (not formally assessed)                                           Pertinent Vitals/Pain Pain Assessment Pain Assessment: No/denies pain    Home Living Family/patient expects to be discharged to:: Private residence Living Arrangements: Spouse/significant other Available Help at Discharge: Family Type of Home: House Home Access:  Stairs to enter Entrance Stairs-Rails: Can reach both Entrance Stairs-Number of Steps: 3   Home Layout: One level Home Equipment: Grab bars - tub/shower;Hand held shower head;BSC/3in1 Additional Comments: DME from brother in law    Prior Function Prior Level of Function : Independent/Modified Independent             Mobility Comments: 1 fall in 64moADLs Comments: wife assists with medication mgt     Hand Dominance        Extremity/Trunk Assessment   Upper  Extremity Assessment Upper Extremity Assessment: Overall WFL for tasks assessed    Lower Extremity Assessment Lower Extremity Assessment: Overall WFL for tasks assessed    Cervical / Trunk Assessment Cervical / Trunk Assessment: Normal  Communication   Communication: No difficulties  Cognition Arousal/Alertness: Awake/alert Behavior During Therapy: WFL for tasks assessed/performed Overall Cognitive Status: Within Functional Limits for tasks assessed                                          General Comments      Exercises     Assessment/Plan    PT Assessment Patient needs continued PT services  PT Problem List Decreased activity tolerance;Cardiopulmonary status limiting activity       PT Treatment Interventions Gait training    PT Goals (Current goals can be found in the Care Plan section)  Acute Rehab PT Goals Patient Stated Goal: to go home PT Goal Formulation: With patient Time For Goal Achievement: 10/17/22 Potential to Achieve Goals: Good    Frequency Min 2X/week     Co-evaluation               AM-PAC PT "6 Clicks" Mobility  Outcome Measure Help needed turning from your back to your side while in a flat bed without using bedrails?: None Help needed moving from lying on your back to sitting on the side of a flat bed without using bedrails?: None Help needed moving to and from a bed to a chair (including a wheelchair)?: None Help needed standing up from a chair using your arms (e.g., wheelchair or bedside chair)?: None Help needed to walk in hospital room?: A Little Help needed climbing 3-5 steps with a railing? : A Little 6 Click Score: 22    End of Session Equipment Utilized During Treatment: Oxygen Activity Tolerance: Patient tolerated treatment well Patient left: in bed Nurse Communication: Mobility status PT Visit Diagnosis: Difficulty in walking, not elsewhere classified (R26.2)    Time: [address]-2130PT Time Calculation  (min) (ACUTE ONLY): 22 min   Charges:   PT Evaluation $PT Eval Low Complexity: 1 Low PT Treatments $Gait Training: 8-22 mins        SGreggory Stallion PT, DPT, [facility] [phone]  [name] 10/03/2022, 2:58 PM

## 2022-10-03 NOTE — Assessment & Plan Note (Addendum)
-   This is likely hypovolemic. - We will continue hydration with IV normal saline and follow sodium level as mentioned above.  Follow-up labs in the morning.

## 2022-10-03 NOTE — Assessment & Plan Note (Signed)
-   We will continue his antihypertensives while holding off nephrotoxins.

## 2022-10-03 NOTE — Assessment & Plan Note (Addendum)
Unclear etiology.  May be an acute bronchiolitis.  Lung exam is clear with no wheezing.  Procalcitonin normal and COVID, flu and PE ruled out.  We will plan to continue nebulizers and steroids and mucolytic therapy.  Discontinue antibiotics given normal procalcitonin.  Work to wean off of oxygen.  Respiratory panel pending.

## 2022-10-03 NOTE — Evaluation (Signed)
Occupational Therapy Evaluation Patient Details Name: Samuel Anthony MRN: [id number] DOB: Dec 28, 1945 Today's Date: 10/03/2022   History of Present Illness 76 y.o. pleasant Caucasian male with medical history significant for asthma, type diabetes mellitus, hypertension, CVA and hypothyroidism, presented to the ER with worsening dyspnea with associated congested cough with inability to expectorate as well as occasional wheezing that started about a week ago when he was at the beach.  He was seen there and diagnosed with pneumonia for which he was given Z-Pak and Augmentin. He had hypoxia when his wife checked his pulse oximetry on 10/02/22 and found it 81 to 89% on room air.  He does not use oxygen at home.   Clinical Impression   Pt was seen for OT evaluation this date. Prior to hospital admission, pt was independent in all aspects except spouse manages medications. Pt reports recently noting increased BLE weakness and SOB leading to admission. Pt lives with his spouse in a 1 story home. Pt presents to acute OT demonstrating impaired ADL performance and functional mobility 2/2 decr activity tolerance, chronic low back issues/pain (denies pain during session) (See OT problem list). Pt currently requires PRN MIN A for LB ADL tasks. SpO2 >93-96% on 3L O2. Pt denies SOB while resting, endorses getting mild SOB with limited exertion. Pt declines OOB after just getting up with PT just prior to session. Pt educated in PLB, energy conservation, AE/DME, and falls prevention strategies to maximize safety/indep with ADL/IADL/mobility. Pt verbalized understanding. Pt would benefit from skilled OT services to address noted impairments and functional limitations (see below for any additional details) in order to maximize safety and independence while minimizing falls risk and caregiver burden. Upon hospital discharge, do not anticipate additional skilled OT needs. Will continue to assess.     Recommendations for  follow up therapy are one component of a multi-disciplinary discharge planning process, led by the attending physician.  Recommendations may be updated based on patient status, additional functional criteria and insurance authorization.   Follow Up Recommendations  No OT follow up     Assistance Recommended at Discharge PRN  Patient can return home with the following A little help with bathing/dressing/bathroom;Assistance with cooking/housework;Assist for transportation;Help with stairs or ramp for entrance    Functional Status Assessment  Patient has had a recent decline in their functional status and demonstrates the ability to make significant improvements in function in a reasonable and predictable amount of time.  Equipment Recommendations  Other (comment) Management consultant)    Recommendations for Other Services       Precautions / Restrictions Precautions Precautions: Fall Restrictions Weight Bearing Restrictions: No      Mobility Bed Mobility Overal bed mobility: Modified Independent                  Transfers                   General transfer comment: pt declined, just up with PT prior to OT's arrival. Per PT, pt moving well      Balance                                           ADL either performed or assessed with clinical judgement   ADL  General ADL Comments: Pt able to doff/don socks long sitting in bed, endorses this is typically harder when seated 2/2 hx chronic low back issues/pain. Anticipate pt requires PRN MIN A for LB ADL tasks at this time. Pt reports spouse able to assist.     Vision         Perception     Praxis      Pertinent Vitals/Pain Pain Assessment Pain Assessment: No/denies pain     Hand Dominance     Extremity/Trunk Assessment Upper Extremity Assessment Upper Extremity Assessment: Overall WFL for tasks assessed   Lower Extremity  Assessment Lower Extremity Assessment: Defer to PT evaluation   Cervical / Trunk Assessment Cervical / Trunk Assessment: Normal   Communication Communication Communication: No difficulties   Cognition Arousal/Alertness: Awake/alert Behavior During Therapy: WFL for tasks assessed/performed Overall Cognitive Status: Within Functional Limits for tasks assessed                                       General Comments       Exercises Other Exercises Other Exercises: Pt educated in PLB, energy conservation, AE/DME, and falls prevention strategies to maximize safety/indep with ADL/IADL/mobility. Pt verbalized understanding.   Shoulder Instructions      Home Living Family/patient expects to be discharged to:: Private residence Living Arrangements: Spouse/significant other Available Help at Discharge: Family Type of Home: House Home Access: Stairs to enter Technical brewer of Steps: 2-3   Home Layout: One level     Bathroom Shower/Tub: Occupational psychologist: Standard     Home Equipment: Grab bars - tub/shower;Hand held shower head;BSC/3in1   Additional Comments: DME from brother in law      Prior Functioning/Environment Prior Level of Function : Independent/Modified Independent             Mobility Comments: 1 fall in 79moADLs Comments: wife assists with medication mgt        OT Problem List: Cardiopulmonary status limiting activity;Decreased knowledge of use of DME or AE;Decreased activity tolerance      OT Treatment/Interventions: Self-care/ADL training;Therapeutic exercise;Therapeutic activities;Energy conservation;DME and/or AE instruction;Patient/family education;Balance training    OT Goals(Current goals can be found in the care plan section) Acute Rehab OT Goals Patient Stated Goal: get better and go home OT Goal Formulation: With patient Time For Goal Achievement: 10/17/22 Potential to Achieve Goals: Good ADL Goals Pt  Will Perform Lower Body Dressing: with modified independence;with adaptive equipment;sit to/from stand Additional ADL Goal #1: Pt will complete all aspects of bathing primarily from seated position, with mod indep, SpO2 >90% on room air, 2/2 opportunities. Additional ADL Goal #2: Pt will verbalize plan to implement at least 2 learned ECS to maximize safety/indep with ADL/IADL and minimize SOB/over exertion.  OT Frequency: Min 1X/week    Co-evaluation              AM-PAC OT "6 Clicks" Daily Activity     Outcome Measure Help from another person eating meals?: None Help from another person taking care of personal grooming?: None Help from another person toileting, which includes using toliet, bedpan, or urinal?: None Help from another person bathing (including washing, rinsing, drying)?: A Little Help from another person to put on and taking off regular upper body clothing?: None Help from another person to put on and taking off regular lower body clothing?: A Little 6 Click Score: 22  End of Session Equipment Utilized During Treatment: Oxygen  Activity Tolerance: Patient tolerated treatment well Patient left: in bed;with call bell/phone within reach  OT Visit Diagnosis: Other abnormalities of gait and mobility (R26.89)                Time: 5001-6429 OT Time Calculation (min): 19 min Charges:  OT General Charges $OT Visit: 1 Visit OT Evaluation $OT Eval Low Complexity: 1 Low OT Treatments $Self Care/Home Management : 8-22 mins  Ardeth Perfect., MPH, MS, OTR/L ascom [phone] 10/03/22, 2:25 PM

## 2022-10-03 NOTE — Assessment & Plan Note (Signed)
-   We will continue Jardiance and Neurontin. - The patient will be placed on supplement coverage with NovoLog.

## 2022-10-03 NOTE — Assessment & Plan Note (Signed)
Will continue allopurinol.

## 2022-10-03 NOTE — Hospital Course (Signed)
76 year old male with past medical history of asthma, diabetes mellitus, hypertension, and previous CVA who presented to the emergency room on 11/19 with cough and shortness of breath x1 week.  When symptoms started, patient was on vacation and seen at an urgent care where he was diagnosed with pneumonia and given Augmentin and a Z-Pak.  Patient has completed course of the latter and continues to take Augmentin, but presented to the emergency room when wife checked pulse ox and found oxygen levels to be 81 to 89% on room air (patient not normally on oxygen).  In the emergency room, found to have oxygen saturations in the 70s with ambulation.  COVID and flu titers negative.  CT scan noted no evidence of PE, but did note bilateral lower lobe peribronchial wall thickening compatible with bronchitis as well as a 6 mm proximal right lower lobe density, felt to likely be mucous plug.  Patient mated to the hospitalist service and started on IV antibiotics plus steroids and fluids.

## 2022-10-04 DIAGNOSIS — J121 Respiratory syncytial virus pneumonia: Secondary | ICD-10-CM

## 2022-10-04 LAB — BASIC METABOLIC PANEL
Anion gap: 8 (ref 5–15)
BUN: 39 mg/dL — ABNORMAL HIGH (ref 8–23)
CO2: 26 mmol/L (ref 22–32)
Calcium: 8.7 mg/dL — ABNORMAL LOW (ref 8.9–10.3)
Chloride: 101 mmol/L (ref 98–111)
Creatinine, Ser: 1.29 mg/dL — ABNORMAL HIGH (ref 0.61–1.24)
GFR, Estimated: 58 mL/min — ABNORMAL LOW (ref >60)
Glucose, Bld: 184 mg/dL — ABNORMAL HIGH (ref 70–99)
Potassium: 4.4 mmol/L (ref 3.5–5.1)
Sodium: 135 mmol/L (ref 135–145)

## 2022-10-04 NOTE — Progress Notes (Addendum)
1901 - received report  2122 - pt awakened to administer scheduled hs medication 2304 - pt resting with eyes closed lying supine, no signs of distress or discomfort noted, respirations even and unlabored 0116 - resting with eyes closed lying supine, no signs of distress or discomfort noted, respirations even and unlabored. Urinary suction canister emptied 0323 - pt resting in bed left side lying with eyes closed, respirations even and unlabored, no signs of distress or discomfort noted 0545 - pt awakened for administration of scheduled medication. Denies needs

## 2022-10-04 NOTE — Progress Notes (Signed)
[facility] Progress Note  Patient: Samuel Anthony    CBJ:[id number]  DOA: 10/02/2022    Date of Service: the patient was seen and examined on 10/04/2022  Brief hospital course: 76 year old male with past medical history of asthma, diabetes mellitus, hypertension, and previous CVA who presented to the emergency room on 11/19 with cough and shortness of breath x1 week.  When symptoms started, patient was on vacation and seen at an urgent care where he was diagnosed with pneumonia and given Augmentin and a Z-Pak.  Patient has completed course of the latter and continues to take Augmentin, but presented to the emergency room when wife checked pulse ox and found oxygen levels to be 81 to 89% on room air (patient not normally on oxygen).  In the emergency room, found to have oxygen saturations in the 70s with ambulation.  COVID and flu titers negative.  CT scan noted no evidence of PE, but did note bilateral lower lobe peribronchial wall thickening compatible with bronchitis as well as a 6 mm proximal right lower lobe density, felt to likely be mucous plug.  Patient admitted to the hospitalist service and started on IV antibiotics plus steroids and fluids.  Respiratory panel returned for RSV.  Assessment and Plan: Assessment and Plan: * Acute respiratory failure with hypoxia ([place]) Unclear etiology.  May be an acute bronchiolitis.  Lung exam is clear with no wheezing.  Procalcitonin normal and COVID, flu and PE ruled out.  We will plan to continue nebulizers and steroids and mucolytic therapy.  Discontinue antibiotics given normal procalcitonin.  Work to wean off of oxygen.  Respiratory panel pending.  RSV (respiratory syncytial virus pneumonia) Underlying cause of bronchiolitis and respiratory failure.  Symptomatic treatment.  AKI (acute kidney injury) ([place]) Creatinine peaked at 1.53 with GFR 46.  No known previous history of renal disease.  Responding to IV fluids and creatinine at 1.29  today.  Hyponatremia-resolved as of 10/04/2022 - This is likely hypovolemic.  Resolved with IV fluids.  Obstructive sleep apnea Not always compliant with CPAP.  Encouraged patient to do so.  Wife is bringing in patient's machine from home.  Essential hypertension - We will continue his antihypertensives while holding off nephrotoxins.  Type 2 diabetes mellitus with peripheral neuropathy (HCC) - We will continue Jardiance and Neurontin. - The patient will be placed on supplement coverage with NovoLog.  Hypothyroidism - We will resume synthroid and cytomel.  Overweight (BMI 25.0-29.9) Meets criteria BMI greater than 25  Gout Will continue allopurinol.       Body mass index is 28.8 kg/m.        Consultants: None  Procedures: None   Antimicrobials: IV Rocephin and Zithromax 11/19 - 11/20  Code Status: Full code   Subjective: Breathing continues to improve.  Objective: Vital signs were reviewed and unremarkable. Vitals:   10/04/22 0732 10/04/22 0900  BP: (!) 152/80   Pulse: 77   Resp: 18   Temp: 98 F (36.7 C)   SpO2: 96% 93%    Intake/Output Summary (Last 24 hours) at 10/04/2022 1500 Last data filed at 10/04/2022 1220 Gross per 24 hour  Intake 2643.89 ml  Output 2400 ml  Net 243.89 ml    Filed Weights   10/02/22 1915  Weight: 88.5 kg   Body mass index is 28.8 kg/m.  Exam:  General: Alert and oriented x3, no acute distress HEENT: Normocephalic, atraumatic, mucous membranes are moist Cardiovascular: Regular rate and rhythm, S1-S2 Respiratory: Clear to auscultation bilaterally, no  wheezing, better air exchange today Abdomen: Abdomen soft, nontender, nondistended, positive bowel sounds Musculoskeletal: No clubbing or cyanosis or edema Skin: No skin breaks, tears or lesions Psychiatry: Appropriate, no evidence of psychoses Neurology: No focal deficits  Data Reviewed: Creatinine at 1.29.  Positive RSV titer  Disposition:  Status is:  Inpatient Remains inpatient appropriate because:  -Improvement in hypoxia    Anticipated discharge date: 11/22  Family Communication: We will call wife DVT Prophylaxis: enoxaparin (LOVENOX) injection 40 mg Start: 10/03/22 0800    Author: Annita Brod ,MD 10/04/2022 3:00 PM  To reach On-call, see care teams to locate the attending and reach out via [email or web address]. Between 7PM-7AM, please contact night-coverage If you still have difficulty reaching the attending provider, please page the [facility] (Director on Call) for [facility] on amion for assistance.

## 2022-10-04 NOTE — Progress Notes (Signed)
Order received from Dr [place] Pink to discontinue telemetry and IVF

## 2022-10-04 NOTE — Progress Notes (Signed)
Occupational Therapy Treatment Patient Details Name: Samuel Anthony MRN: [id number] DOB: 11/25/1945 Today's Date: 10/04/2022   History of present illness 76 y.o. pleasant Caucasian male with medical history significant for asthma, type diabetes mellitus, hypertension, CVA and hypothyroidism, presented to the ER with worsening dyspnea with associated congested cough with inability to expectorate as well as occasional wheezing that started about a week ago when he was at the beach.  He was seen there and diagnosed with pneumonia for which he was given Z-Pak and Augmentin. He had hypoxia when his wife checked his pulse oximetry on 10/02/22 and found it 81 to 89% on room air.  He does not use oxygen at home.   OT comments  Pt seen for OT tx this date. Pt pleasant, endorsing feeling a bit better and eager to participate. Pt demonstrated slightly increased effort to complete all mobility tasks this date with decreased balance noted requiring CGA-HHA with in-room mobility, CGA for ADL transfers, and 1 slight LOB requiring MIN  A to correct. SpO2 87-90% on 2.5L and HR 90's-100's with exertion. Pt endorses at home needing to "get his legs warmed up" before taking his dog out for a walk and admits he is a bit unsteady when he hasn't moved in a while. Additional instruction provided in ECS and activity pacing, PLB, and importance of avoiding over exertion. Pt verbalized understanding. Pt continues to benefit from skilled OT services to ensure pt returns to PLOF.    Recommendations for follow up therapy are one component of a multi-disciplinary discharge planning process, led by the attending physician.  Recommendations may be updated based on patient status, additional functional criteria and insurance authorization.    Follow Up Recommendations  No OT follow up     Assistance Recommended at Discharge PRN  Patient can return home with the following  A little help with bathing/dressing/bathroom;Assistance  with cooking/housework;Assist for transportation;Help with stairs or ramp for entrance;A little help with walking and/or transfers   Equipment Recommendations  Other (comment) (reacher)    Recommendations for Other Services      Precautions / Restrictions Precautions Precautions: Fall Restrictions Weight Bearing Restrictions: No       Mobility Bed Mobility Overal bed mobility: Needs Assistance Bed Mobility: Supine to Sit, Sit to Supine     Supine to sit: Supervision, HOB elevated Sit to supine: Supervision   General bed mobility comments: +time/effort to complete    Transfers Overall transfer level: Needs assistance Equipment used: None Transfers: Sit to/from Stand Sit to Stand: Min guard           General transfer comment: increased effort, decr balance     Balance Overall balance assessment: Needs assistance Sitting-balance support: Feet supported, Single extremity supported, No upper extremity supported Sitting balance-Leahy Scale: Fair     Standing balance support: Single extremity supported, No upper extremity supported, During functional activity Standing balance-Leahy Scale: Fair Standing balance comment: fair-, intermittent UE support on footboard of bed for stability                           ADL either performed or assessed with clinical judgement   ADL                                         General ADL Comments: Set up and supv for donning shoes, CGA for  LB dressing involving STS transfers. CGA at sink for grooming. CGA to HHA for ADL mobility 2/2 decr balance    Extremity/Trunk Assessment              Vision       Perception     Praxis      Cognition Arousal/Alertness: Awake/alert Behavior During Therapy: WFL for tasks assessed/performed Overall Cognitive Status: Within Functional Limits for tasks assessed                                 General Comments: grossly WFL, questionable  insight into deficits        Exercises Other Exercises Other Exercises: Additional instruction provided in ECS and activity pacing, PLB, and importance of avoiding over exertion.    Shoulder Instructions       General Comments SpO2 87-90% on 2.5Lwith exertion, HR in 90's to 100's. Pt endorsed 4-5/10 perceived rate of exertion    Pertinent Vitals/ Pain       Pain Assessment Pain Assessment: No/denies pain  Home Living                                          Prior Functioning/Environment              Frequency  Min 1X/week        Progress Toward Goals  OT Goals(current goals can now be found in the care plan section)  Progress towards OT goals: OT to reassess next treatment  Acute Rehab OT Goals Patient Stated Goal: get better and go home OT Goal Formulation: With patient Time For Goal Achievement: 10/17/22 Potential to Achieve Goals: Good  Plan Discharge plan remains appropriate;Frequency remains appropriate    Co-evaluation                 AM-PAC OT "6 Clicks" Daily Activity     Outcome Measure   Help from another person eating meals?: None Help from another person taking care of personal grooming?: A Little Help from another person toileting, which includes using toliet, bedpan, or urinal?: A Little Help from another person bathing (including washing, rinsing, drying)?: A Little Help from another person to put on and taking off regular upper body clothing?: None Help from another person to put on and taking off regular lower body clothing?: A Little 6 Click Score: 20    End of Session Equipment Utilized During Treatment: Oxygen  OT Visit Diagnosis: Other abnormalities of gait and mobility (R26.89)   Activity Tolerance Patient tolerated treatment well   Patient Left in bed;with call bell/phone within reach;with bed alarm set   Nurse Communication          Time: 2458-0998 OT Time Calculation (min): 22 min  Charges:  OT General Charges $OT Visit: 1 Visit OT Treatments $Self Care/Home Management : 8-22 mins  Ardeth Perfect., MPH, MS, OTR/L ascom [phone] 10/04/22, 12:32 PM

## 2022-10-04 NOTE — Progress Notes (Signed)
Nutrition Brief Note  RD received consult for nutritional assessment   76 y/o male with h/o HTN, hypothyroidism, DM, OSA. Gout, DDD and CVA who is admitted with RSV.   Met with pt in room today. Pt reports decreased oral intake for ~ 5 days pta r/t acute illness. Pt reports that his appetite and oral intake is improved in hospital. Pt eating 100% of meals.   Wt Readings from Last 15 Encounters:  10/02/22 88.5 kg  08/03/22 89.8 kg  06/22/22 91 kg    Body mass index is 28.8 kg/m. Patient meets criteria for overweight based on current BMI. Pt reports that his weight is stable around 195lbs. Per chart, pt is down 13lbs(6%) over the past year; this is not significant.   Nutrition-Focused physical exam completed. Findings are no fat depletion, no muscle depletion, and no edema.   Current diet order is heart healthy, patient is consuming approximately 100% of meals at this time. Labs and medications reviewed.   No nutrition interventions warranted at this time. If nutrition issues arise, please consult RD.   Koleen Distance MS, RD, LDN Please refer to [facility] for RD and/or RD on-call/weekend/after hours pager

## 2022-10-04 NOTE — Assessment & Plan Note (Signed)
Underlying cause of bronchiolitis and respiratory failure.  Symptomatic treatment.

## 2022-10-04 NOTE — Progress Notes (Signed)
Physical Therapy Treatment Patient Details Name: LUCIA MCCREADIE MRN: [id number] DOB: 1946/03/07 Today's Date: 10/04/2022   History of Present Illness 76 y.o. pleasant Caucasian male with medical history significant for asthma, type diabetes mellitus, hypertension, CVA and hypothyroidism, presented to the ER with worsening dyspnea with associated congested cough with inability to expectorate as well as occasional wheezing that started about a week ago when he was at the beach.  He was seen there and diagnosed with pneumonia for which he was given Z-Pak and Augmentin. He had hypoxia when his wife checked his pulse oximetry on 10/02/22 and found it 81 to 89% on room air.  He does not use oxygen at home.    PT Comments    Pt received in bed, 95% at rest on 3L O2 via [place]. Pt able to complete bed mobility and transfers with Supervision for safety due to impulsivity and decreased safety awareness. Pt completed 175f of gait training with SPC (baseline) while on 3L O2 with sats decreasing to 91%, increased wheezing noted, no cough. Pt continues to functionally improve with increased tolerance for activity.    Recommendations for follow up therapy are one component of a multi-disciplinary discharge planning process, led by the attending physician.  Recommendations may be updated based on patient status, additional functional criteria and insurance authorization.  Follow Up Recommendations  No PT follow up     Assistance Recommended at Discharge PRN  Patient can return home with the following A little help with walking and/or transfers;Help with stairs or ramp for entrance   Equipment Recommendations  None recommended by PT    Recommendations for Other Services       Precautions / Restrictions Precautions Precautions: Fall Restrictions Weight Bearing Restrictions: No     Mobility  Bed Mobility Overal bed mobility: Needs Assistance Bed Mobility: Supine to Sit, Sit to Supine     Supine  to sit: Supervision, HOB elevated Sit to supine: Supervision   General bed mobility comments: impulsive this pm    Transfers Overall transfer level: Needs assistance Equipment used: None Transfers: Sit to/from Stand Sit to Stand: Supervision                Ambulation/Gait Ambulation/Gait assistance: Supervision Gait Distance (Feet): 150 Feet Assistive device: Straight cane Gait Pattern/deviations: Step-through pattern, Drifts right/left       General Gait Details: ambulated with wide BOS and ER of L hip. No pain reported. No unsteadiness, however does demonstate wheezing with increased distances. All mobility performed on 3L of O2 with sats at 91% with exertion. Quick recovery once seated   Stairs             Wheelchair Mobility    Modified Rankin (Stroke Patients Only)       Balance Overall balance assessment: Needs assistance Sitting-balance support: Feet supported, Single extremity supported, No upper extremity supported Sitting balance-Leahy Scale: Good     Standing balance support: Single extremity supported, No upper extremity supported, During functional activity Standing balance-Leahy Scale: Fair Standing balance comment:  (No LOB)                            Cognition Arousal/Alertness: Awake/alert Behavior During Therapy: WFL for tasks assessed/performed Overall Cognitive Status: Within Functional Limits for tasks assessed  General Comments: grossly WFL, questionable insight into deficits        Exercises Other Exercises Other Exercises: Education provided regarding pacing self and decreasing impulsivity.    General Comments General comments (skin integrity, edema, etc.):  (95% 0n 3L O2 at rest, desated to 91% upon completing gait)      Pertinent Vitals/Pain Pain Assessment Pain Assessment: No/denies pain    Home Living                          Prior Function             PT Goals (current goals can now be found in the care plan section) Acute Rehab PT Goals Patient Stated Goal: to go home Progress towards PT goals: Progressing toward goals    Frequency    Min 2X/week      PT Plan Current plan remains appropriate    Co-evaluation              AM-PAC PT "6 Clicks" Mobility   Outcome Measure  Help needed turning from your back to your side while in a flat bed without using bedrails?: None Help needed moving from lying on your back to sitting on the side of a flat bed without using bedrails?: None Help needed moving to and from a bed to a chair (including a wheelchair)?: None Help needed standing up from a chair using your arms (e.g., wheelchair or bedside chair)?: None Help needed to walk in hospital room?: A Little Help needed climbing 3-5 steps with a railing? : A Little 6 Click Score: 22    End of Session Equipment Utilized During Treatment: Gait belt;Oxygen Activity Tolerance: Patient tolerated treatment well Patient left: in chair;with call bell/phone within reach Nurse Communication: Mobility status PT Visit Diagnosis: Difficulty in walking, not elsewhere classified (R26.2)     Time: 0300-9233 PT Time Calculation (min) (ACUTE ONLY): 25 min  Charges:  $Gait Training: 8-22 mins $Therapeutic Activity: 8-22 mins                    Mikel Cella, PTA   Josie Dixon 10/04/2022, 2:39 PM

## 2022-10-04 NOTE — TOC Initial Note (Signed)
Transition of Care [facility]) - Initial/Assessment Note    Patient Details  Name: Samuel Anthony MRN: [id number] Date of Birth: 1946/08/13  [facility] CM/SW Contact:    Beverly Sessions, RN Phone Number: 10/04/2022, 9:45 AM  Clinical Narrative:                  Admitted for: asthma  Admitted from: home with wife  PCP: Ouida Sills Current home health/prior home health/DME:Grab bars - tub/shower;Hand held shower head;BSC/3in1   Patient currently requiring 2.5 L acute O2. And receiving nebulizer treatments         Patient Goals and CMS Choice        Expected Discharge Plan and Services                                                Prior Living Arrangements/Services                       Activities of Daily Living Home Assistive Devices/Equipment: CBG Meter, CPAP ADL Screening (condition at time of admission) Patient's cognitive ability adequate to safely complete daily activities?: Yes Is the patient deaf or have difficulty hearing?: No Does the patient have difficulty seeing, even when wearing glasses/contacts?: No Does the patient have difficulty concentrating, remembering, or making decisions?: No Patient able to express need for assistance with ADLs?: Yes Does the patient have difficulty dressing or bathing?: No Independently performs ADLs?: Yes (appropriate for developmental age) Does the patient have difficulty walking or climbing stairs?: No Weakness of Legs: None Weakness of Arms/Hands: None  Permission Sought/Granted                  Emotional Assessment              Admission diagnosis:  SOB (shortness of breath) [R06.02] Hypoxia [R09.02] Acute respiratory failure with hypoxia ([place]) [J96.01] Patient Active Problem List   Diagnosis Date Noted   AKI (acute kidney injury) ([place]) 10/03/2022   Hyponatremia 10/03/2022   Essential hypertension 10/03/2022   Asthma, chronic, unspecified asthma severity, with acute  exacerbation 10/03/2022   Acute bronchiolitis 10/03/2022   Gout 10/03/2022   Hypothyroidism 10/03/2022   Type 2 diabetes mellitus with peripheral neuropathy ([place]) 10/03/2022   Overweight (BMI 25.0-29.9) 10/03/2022   Obstructive sleep apnea 10/03/2022   Acute respiratory failure with hypoxia ([place]) 10/02/2022   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

## 2022-10-05 LAB — BASIC METABOLIC PANEL
Anion gap: 6 (ref 5–15)
BUN: 39 mg/dL — ABNORMAL HIGH (ref 8–23)
CO2: 29 mmol/L (ref 22–32)
Calcium: 9 mg/dL (ref 8.9–10.3)
Chloride: 102 mmol/L (ref 98–111)
Creatinine, Ser: 1.27 mg/dL — ABNORMAL HIGH (ref 0.61–1.24)
GFR, Estimated: 59 mL/min — ABNORMAL LOW (ref >60)
Glucose, Bld: 146 mg/dL — ABNORMAL HIGH (ref 70–99)
Potassium: 4.6 mmol/L (ref 3.5–5.1)
Sodium: 137 mmol/L (ref 135–145)

## 2022-10-05 MED ORDER — ALBUTEROL SULFATE HFA 108 (90 BASE) MCG/ACT IN AERS: 1.0000 | INHALATION_SPRAY | Freq: Four times a day (QID) | RESPIRATORY_TRACT | 0 refills | Status: AC | PRN

## 2022-10-05 MED ORDER — PREDNISONE 20 MG PO TABS: 40.0000 mg | ORAL_TABLET | Freq: Every day | ORAL | 0 refills | Status: AC

## 2022-10-05 MED ORDER — HYDROCOD POLI-CHLORPHE POLI ER 10-8 MG/5ML PO SUER: 5.0000 mL | Freq: Two times a day (BID) | ORAL | 0 refills | Status: AC | PRN

## 2022-10-05 NOTE — Discharge Summary (Signed)
Physician Discharge Summary  Samuel Anthony [id number] DOB: Mar 31, 1946 DOA: 10/02/2022  PCP: Samuel Ruths, MD  Admit date: 10/02/2022 Discharge date: 10/05/2022  Admitted From: Home Disposition:  Home  Recommendations for Outpatient Follow-up:  Follow up with PCP in 1-2 weeks   Home Health:No Equipment/Devices:None   Discharge Condition:Stable  CODE STATUS:FULL  Diet recommendation: Reg  Brief/Interim Summary: 76 year old male with past medical history of asthma, diabetes mellitus, hypertension, and previous CVA who presented to the emergency room on 11/19 with cough and shortness of breath x1 week.  When symptoms started, patient was on vacation and seen at an urgent care where he was diagnosed with pneumonia and given Augmentin and a Z-Pak.  Patient has completed course of the latter and continues to take Augmentin, but presented to the emergency room when wife checked pulse ox and found oxygen levels to be 81 to 89% on room air (patient not normally on oxygen).  In the emergency room, found to have oxygen saturations in the 70s with ambulation.  COVID and flu titers negative.  CT scan noted no evidence of PE, but did note bilateral lower lobe peribronchial wall thickening compatible with bronchitis as well as a 6 mm proximal right lower lobe density, felt to likely be mucous plug.  Patient admitted to the hospitalist service and started on IV antibiotics plus steroids and fluids.  Respiratory panel returned for RSV.   Patient's respiratory status slowly improved during course of admission.  On day of discharge he was initially on 2.5 L nasal cannula.  This was weaned off and patient was ambulated.  Was able to ambulate around nursing unit on room air without desaturation.  Remained above 90%.  Appropriate for discharge at this time.  No indication for home oxygen.  Will prescribe short course of p.o. prednisone and refill home albuterol MDI.  Follow-up outpatient  PCP.    Discharge Diagnoses:  Principal Problem:   Acute respiratory failure with hypoxia (HCC) Active Problems:   Acute bronchiolitis   RSV (respiratory syncytial virus pneumonia)   AKI (acute kidney injury) (HCC)   Asthma, chronic, unspecified asthma severity, with acute exacerbation   Obstructive sleep apnea   Essential hypertension   Type 2 diabetes mellitus with peripheral neuropathy (HCC)   Hypothyroidism   Overweight (BMI 25.0-29.9)  * Acute respiratory failure with hypoxia (HCC) Resolved at time of discharge.  Suspect secondary to acute bronchiolitis.  Lung exam clear.  Procalcitonin normal.  RSV positive.   RSV (respiratory syncytial virus pneumonia) Underlying cause of bronchiolitis and respiratory failure.  Symptomatic treatment.  Resolved at time of DC   AKI (acute kidney injury) ([place]) Creatinine peaked at 1.53 with GFR 46.  No known previous history of renal disease.  Responding to IV fluids and creatinine at 1.29 today.  Creatinine proving at time of discharge.  Outpatient PCP follow-up.    Discharge Instructions  Discharge Instructions     Diet - low sodium heart healthy   Complete by: As directed    Increase activity slowly   Complete by: As directed       Allergies as of 10/05/2022       Reactions   Codeine Nausea Only, Other (See Comments)   Oxycodone-acetaminophen Nausea Only   Atorvastatin Other (See Comments)   Other Reaction: OTHER REACTION MUSCLE PAIN AND   Fenofibrate Micronized Other (See Comments)   Other Reaction: OTHER REACTION: MUSCLE PAIN   Levofloxacin Other (See Comments)   Tricor [fenofibrate]  Medication List     STOP taking these medications    amoxicillin-clavulanate 875-125 MG tablet Commonly known as: AUGMENTIN   azithromycin 250 MG tablet Commonly known as: ZITHROMAX       TAKE these medications    albuterol 108 (90 Base) MCG/ACT inhaler Commonly known as: VENTOLIN HFA Inhale 1 puff into the lungs  every 6 (six) hours as needed for wheezing or shortness of breath.   alfuzosin 10 MG 24 hr tablet Commonly known as: UROXATRAL Take 10 mg by mouth daily with breakfast.   amLODipine 10 MG tablet Commonly known as: NORVASC Take 1 tablet by mouth daily.   benzonatate 200 MG capsule Commonly known as: TESSALON Take by mouth.   carvedilol 12.5 MG tablet Commonly known as: COREG Take 12.5 mg by mouth 2 (two) times daily with a meal.   chlorpheniramine-HYDROcodone 10-8 MG/5ML Commonly known as: TUSSIONEX Take 5 mLs by mouth every 12 (twelve) hours as needed for up to 7 days for cough.   clopidogrel 75 MG tablet Commonly known as: PLAVIX Take 75 mg by mouth daily.   ezetimibe 10 MG tablet Commonly known as: ZETIA Take 10 mg by mouth daily.   fluticasone 50 MCG/ACT nasal spray Commonly known as: FLONASE Place into both nostrils daily.   Frankincense Uplifting Oil Inhale 1 Tube into the lungs 1 day or 1 dose.   irbesartan-hydrochlorothiazide 300-12.5 MG tablet Commonly known as: AVALIDE Take 1 tablet by mouth daily.   Jardiance 10 MG Tabs tablet Generic drug: empagliflozin Take 10 mg by mouth daily.   Krill Oil 350 MG Caps Take 1 tablet by mouth 1 day or 1 dose.   levothyroxine 100 MCG tablet Commonly known as: SYNTHROID Take 100 mcg by mouth daily before breakfast.   liothyronine 5 MCG tablet Commonly known as: CYTOMEL Take 5 mcg by mouth daily.   multivitamin capsule Take 1 capsule by mouth daily.   NON FORMULARY Blue mag   predniSONE 20 MG tablet Commonly known as: DELTASONE Take 2 tablets (40 mg total) by mouth daily with breakfast for 4 days. Start taking on: October 06, 2022   QC TUMERIC COMPLEX PO Take 350 mg by mouth 1 day or 1 dose.   rosuvastatin 5 MG tablet Commonly known as: CRESTOR Take 5 mg by mouth 3 (three) times a week.        Allergies  Allergen Reactions   Codeine Nausea Only and Other (See Comments)   Oxycodone-Acetaminophen  Nausea Only   Atorvastatin Other (See Comments)    Other Reaction: OTHER REACTION MUSCLE PAIN AND   Fenofibrate Micronized Other (See Comments)    Other Reaction: OTHER REACTION: MUSCLE PAIN   Levofloxacin Other (See Comments)   Tricor [Fenofibrate]     Consultations: None   Procedures/Studies: CT Angio Chest PE W and/or Wo Contrast  Result Date: 10/02/2022 CLINICAL DATA:  Hypoxia and shortness of breath. EXAM: CT ANGIOGRAPHY CHEST WITH CONTRAST TECHNIQUE: Multidetector CT imaging of the chest was performed using the standard protocol during bolus administration of intravenous contrast. Multiplanar CT image reconstructions and MIPs were obtained to evaluate the vascular anatomy. RADIATION DOSE REDUCTION: This exam was performed according to the departmental dose-optimization program which includes automated exposure control, adjustment of the mA and/or kV according to patient size and/or use of iterative reconstruction technique. CONTRAST:  80m OMNIPAQUE IOHEXOL 350 MG/ML SOLN COMPARISON:  None Available. FINDINGS: Cardiovascular: Satisfactory opacification of the pulmonary arteries to the segmental level. No evidence of pulmonary embolism. Normal heart  size. No pericardial effusion. Mediastinum/Nodes: No enlarged mediastinal, hilar, or axillary lymph nodes. Thyroid gland, trachea, and esophagus demonstrate no significant findings. Lungs/Pleura: There is bilateral lower lobe peribronchial wall thickening. There is a small amount of atelectasis in the left lower lobe. There is a rounded density within the proximal right lower lobe bronchus measuring 6 mm coronal image 7/84. There is also some plugging of more peripheral left lower lobe bronchi. Trachea is patent. No pleural effusion or pneumothorax. Upper Abdomen: No acute abnormality. Musculoskeletal: No chest wall abnormality. No acute or significant osseous findings. Review of the MIP images confirms the above findings. IMPRESSION: 1. No evidence  for pulmonary embolism. 2. Bilateral lower lobe peribronchial wall thickening compatible with bronchitis/bronchiolitis. 3. 6 mm rounded density in the proximal right lower lobe bronchus. This may represent mucous plugging, however, endobronchial lesion cannot be excluded. 4. Small amount of atelectasis in the left lower lobe with left lower lobe mucous plugging. Electronically Signed   By: Ronney Asters M.D.   On: 10/02/2022 22:52   DG Chest Portable 1 View  Result Date: 10/02/2022 CLINICAL DATA:  Shortness of breath EXAM: PORTABLE CHEST 1 VIEW COMPARISON:  None Available. FINDINGS: The heart size and mediastinal contours are within normal limits. Both lungs are clear. The visualized skeletal structures are unremarkable. IMPRESSION: No active disease. Electronically Signed   By: Ronney Asters M.D.   On: 10/02/2022 19:46      Subjective: Seen and examined on the day of discharge.  Stable no distress.  Ambulating without difficulty.  No wheezing.  No adventitious breath sounds.  Stable for discharge home.  Discharge Exam: Vitals:   10/05/22 0733 10/05/22 0819  BP:  118/85  Pulse:  73  Resp:  18  Temp:  97.6 F (36.4 C)  SpO2: 92% 97%   Vitals:   10/04/22 2001 10/05/22 0243 10/05/22 0733 10/05/22 0819  BP: (!) 142/77 (!) 141/88  118/85  Pulse: 75 (!) 56  73  Resp: '17 15  18  '$ Temp: 98.2 F (36.8 C) (!) 97.5 F (36.4 C)  97.6 F (36.4 C)  TempSrc: Oral Oral    SpO2: 97% 97% 92% 97%  Weight:      Height:        General: Pt is alert, awake, not in acute distress Cardiovascular: RRR, S1/S2 +, no rubs, no gallops Respiratory: CTA bilaterally, no wheezing, no rhonchi Abdominal: Soft, NT, ND, bowel sounds + Extremities: no edema, no cyanosis    The results of significant diagnostics from this hospitalization (including imaging, microbiology, ancillary and laboratory) are listed below for reference.     Microbiology: Recent Results (from the past 240 hour(s))  Culture, blood  (Routine x 2)     Status: None (Preliminary result)   Collection Time: 10/02/22  8:27 PM   Specimen: BLOOD  Result Value Ref Range Status   Specimen Description BLOOD BLOOD RIGHT ARM  Final   Special Requests   Final    BOTTLES DRAWN AEROBIC AND ANAEROBIC Blood Culture adequate volume   Culture   Final    NO GROWTH 3 DAYS Performed at [facility], [address] [place], [place] [address]    Report Status PENDING  Incomplete  Culture, blood (Routine x 2)     Status: None (Preliminary result)   Collection Time: 10/02/22  8:27 PM   Specimen: BLOOD  Result Value Ref Range Status   Specimen Description BLOOD BLOOD LEFT ARM  Final   Special Requests  Final    BOTTLES DRAWN AEROBIC AND ANAEROBIC Blood Culture adequate volume   Culture   Final    NO GROWTH 3 DAYS Performed at [facility], [address] [place], [place] [address]    Report Status PENDING  Incomplete  Resp Panel by RT-PCR (Flu A&B, Covid) Anterior Nasal Swab     Status: None   Collection Time: 10/02/22  8:27 PM   Specimen: Anterior Nasal Swab  Result Value Ref Range Status   SARS Coronavirus 2 by RT PCR NEGATIVE NEGATIVE Final    Comment: (NOTE) SARS-CoV-2 target nucleic acids are NOT DETECTED.  The SARS-CoV-2 RNA is generally detectable in upper respiratory specimens during the acute phase of infection. The lowest concentration of SARS-CoV-2 viral copies this assay can detect is 138 copies/mL. A negative result does not preclude SARS-Cov-2 infection and should not be used as the sole basis for treatment or other patient management decisions. A negative result may occur with  improper specimen collection/handling, submission of specimen other than nasopharyngeal swab, presence of viral mutation(s) within the areas targeted by this assay, and inadequate number of viral copies(<138 copies/mL). A negative result must be combined with clinical observations, patient history, and  epidemiological information. The expected result is Negative.  Fact Sheet for Patients:  EntrepreneurPulse.com.au  Fact Sheet for Healthcare Providers:  IncredibleEmployment.be  This test is no t yet approved or cleared by the [place] FDA and  has been authorized for detection and/or diagnosis of SARS-CoV-2 by FDA under an Emergency Use Authorization (EUA). This EUA will remain  in effect (meaning this test can be used) for the duration of the COVID-19 declaration under Section 564(b)(1) of the Act, 21 U.S.C.section 360bbb-3(b)(1), unless the authorization is terminated  or revoked sooner.       Influenza A by PCR NEGATIVE NEGATIVE Final   Influenza B by PCR NEGATIVE NEGATIVE Final    Comment: (NOTE) The Xpert Xpress SARS-CoV-2/FLU/RSV plus assay is intended as an aid in the diagnosis of influenza from Nasopharyngeal swab specimens and should not be used as a sole basis for treatment. Nasal washings and aspirates are unacceptable for Xpert Xpress SARS-CoV-2/FLU/RSV testing.  Fact Sheet for Patients: EntrepreneurPulse.com.au  Fact Sheet for Healthcare Providers: IncredibleEmployment.be  This test is not yet approved or cleared by the [place] FDA and has been authorized for detection and/or diagnosis of SARS-CoV-2 by FDA under an Emergency Use Authorization (EUA). This EUA will remain in effect (meaning this test can be used) for the duration of the COVID-19 declaration under Section 564(b)(1) of the Act, 21 U.S.C. section 360bbb-3(b)(1), unless the authorization is terminated or revoked.  Performed at [facility], [place], [place] [address]   Respiratory (~20 pathogens) panel by PCR     Status: Abnormal   Collection Time: 10/03/22 10:20 AM   Specimen: Nasopharyngeal Swab; Respiratory  Result Value Ref Range Status   Adenovirus NOT DETECTED NOT DETECTED Final    Coronavirus 229E NOT DETECTED NOT DETECTED Final    Comment: (NOTE) The Coronavirus on the Respiratory Panel, DOES NOT test for the novel  Coronavirus (2019 nCoV)    Coronavirus HKU1 NOT DETECTED NOT DETECTED Final   Coronavirus NL63 NOT DETECTED NOT DETECTED Final   Coronavirus OC43 NOT DETECTED NOT DETECTED Final   Metapneumovirus NOT DETECTED NOT DETECTED Final   Rhinovirus / Enterovirus NOT DETECTED NOT DETECTED Final   Influenza A NOT DETECTED NOT DETECTED Final   Influenza B NOT DETECTED NOT  DETECTED Final   Parainfluenza Virus 1 NOT DETECTED NOT DETECTED Final   Parainfluenza Virus 2 NOT DETECTED NOT DETECTED Final   Parainfluenza Virus 3 NOT DETECTED NOT DETECTED Final   Parainfluenza Virus 4 NOT DETECTED NOT DETECTED Final   Respiratory Syncytial Virus DETECTED (A) NOT DETECTED Final   Bordetella pertussis NOT DETECTED NOT DETECTED Final   Bordetella Parapertussis NOT DETECTED NOT DETECTED Final   Chlamydophila pneumoniae NOT DETECTED NOT DETECTED Final   Mycoplasma pneumoniae NOT DETECTED NOT DETECTED Final    Comment: Performed at [facility], [place] [address], [place], [place] [address]     Labs: BNP (last 3 results) Recent Labs    10/03/22 1020  BNP 53.6   Basic Metabolic Panel: Recent Labs  Lab 10/02/22 2027 10/03/22 0253 10/04/22 0437 10/05/22 0447  NA 130* 129* 135 137  K 4.1 4.4 4.4 4.6  CL 92* 91* 101 102  CO2 '27 26 26 29  '$ GLUCOSE 141* 179* 184* 146*  BUN 36* 35* 39* 39*  CREATININE 1.43* 1.56* 1.29* 1.27*  CALCIUM 9.1 8.9 8.7* 9.0   Liver Function Tests: Recent Labs  Lab 10/02/22 2027  AST 38  ALT 52*  ALKPHOS 97  BILITOT 1.1  PROT 7.7  ALBUMIN 4.5   No results for input(s): "LIPASE", "AMYLASE" in the last 168 hours. No results for input(s): "AMMONIA" in the last 168 hours. CBC: Recent Labs  Lab 10/02/22 2027 10/03/22 0253  WBC 10.1 10.1  NEUTROABS 8.3*  --   HGB 16.2 16.3  HCT 44.8 45.5  MCV 87.2 87.8  PLT 237 218    Cardiac Enzymes: No results for input(s): "CKTOTAL", "CKMB", "CKMBINDEX", "TROPONINI" in the last 168 hours. BNP: Invalid input(s): "POCBNP" CBG: No results for input(s): "GLUCAP" in the last 168 hours. D-Dimer No results for input(s): "DDIMER" in the last 72 hours. Hgb A1c No results for input(s): "HGBA1C" in the last 72 hours. Lipid Profile No results for input(s): "CHOL", "HDL", "LDLCALC", "TRIG", "CHOLHDL", "LDLDIRECT" in the last 72 hours. Thyroid function studies No results for input(s): "TSH", "T4TOTAL", "T3FREE", "THYROIDAB" in the last 72 hours.  Invalid input(s): "FREET3" Anemia work up No results for input(s): "VITAMINB12", "FOLATE", "FERRITIN", "TIBC", "IRON", "RETICCTPCT" in the last 72 hours. Urinalysis    Component Value Date/Time   COLORURINE YELLOW (A) 10/03/2022 0158   APPEARANCEUR CLEAR (A) 10/03/2022 0158   LABSPEC 1.025 10/03/2022 0158   PHURINE 5.0 10/03/2022 0158   GLUCOSEU >=500 (A) 10/03/2022 0158   HGBUR NEGATIVE 10/03/2022 0158   BILIRUBINUR NEGATIVE 10/03/2022 0158   KETONESUR 5 (A) 10/03/2022 0158   PROTEINUR 30 (A) 10/03/2022 0158   NITRITE NEGATIVE 10/03/2022 0158   LEUKOCYTESUR NEGATIVE 10/03/2022 0158   Sepsis Labs Recent Labs  Lab 10/02/22 2027 10/03/22 0253  WBC 10.1 10.1   Microbiology Recent Results (from the past 240 hour(s))  Culture, blood (Routine x 2)     Status: None (Preliminary result)   Collection Time: 10/02/22  8:27 PM   Specimen: BLOOD  Result Value Ref Range Status   Specimen Description BLOOD BLOOD RIGHT ARM  Final   Special Requests   Final    BOTTLES DRAWN AEROBIC AND ANAEROBIC Blood Culture adequate volume   Culture   Final    NO GROWTH 3 DAYS Performed at [facility], [address] [place], [place] [address]    Report Status PENDING  Incomplete  Culture, blood (Routine x 2)     Status: None (Preliminary result)  Collection Time: 10/02/22  8:27 PM   Specimen: BLOOD  Result Value Ref Range  Status   Specimen Description BLOOD BLOOD LEFT ARM  Final   Special Requests   Final    BOTTLES DRAWN AEROBIC AND ANAEROBIC Blood Culture adequate volume   Culture   Final    NO GROWTH 3 DAYS Performed at [facility], [address] [place], [place] [address]    Report Status PENDING  Incomplete  Resp Panel by RT-PCR (Flu A&B, Covid) Anterior Nasal Swab     Status: None   Collection Time: 10/02/22  8:27 PM   Specimen: Anterior Nasal Swab  Result Value Ref Range Status   SARS Coronavirus 2 by RT PCR NEGATIVE NEGATIVE Final    Comment: (NOTE) SARS-CoV-2 target nucleic acids are NOT DETECTED.  The SARS-CoV-2 RNA is generally detectable in upper respiratory specimens during the acute phase of infection. The lowest concentration of SARS-CoV-2 viral copies this assay can detect is 138 copies/mL. A negative result does not preclude SARS-Cov-2 infection and should not be used as the sole basis for treatment or other patient management decisions. A negative result may occur with  improper specimen collection/handling, submission of specimen other than nasopharyngeal swab, presence of viral mutation(s) within the areas targeted by this assay, and inadequate number of viral copies(<138 copies/mL). A negative result must be combined with clinical observations, patient history, and epidemiological information. The expected result is Negative.  Fact Sheet for Patients:  EntrepreneurPulse.com.au  Fact Sheet for Healthcare Providers:  IncredibleEmployment.be  This test is no t yet approved or cleared by the [place] FDA and  has been authorized for detection and/or diagnosis of SARS-CoV-2 by FDA under an Emergency Use Authorization (EUA). This EUA will remain  in effect (meaning this test can be used) for the duration of the COVID-19 declaration under Section 564(b)(1) of the Act, 21 U.S.C.section 360bbb-3(b)(1), unless the authorization is  terminated  or revoked sooner.       Influenza A by PCR NEGATIVE NEGATIVE Final   Influenza B by PCR NEGATIVE NEGATIVE Final    Comment: (NOTE) The Xpert Xpress SARS-CoV-2/FLU/RSV plus assay is intended as an aid in the diagnosis of influenza from Nasopharyngeal swab specimens and should not be used as a sole basis for treatment. Nasal washings and aspirates are unacceptable for Xpert Xpress SARS-CoV-2/FLU/RSV testing.  Fact Sheet for Patients: EntrepreneurPulse.com.au  Fact Sheet for Healthcare Providers: IncredibleEmployment.be  This test is not yet approved or cleared by the [place] FDA and has been authorized for detection and/or diagnosis of SARS-CoV-2 by FDA under an Emergency Use Authorization (EUA). This EUA will remain in effect (meaning this test can be used) for the duration of the COVID-19 declaration under Section 564(b)(1) of the Act, 21 U.S.C. section 360bbb-3(b)(1), unless the authorization is terminated or revoked.  Performed at [facility], [place], [place] [address]   Respiratory (~20 pathogens) panel by PCR     Status: Abnormal   Collection Time: 10/03/22 10:20 AM   Specimen: Nasopharyngeal Swab; Respiratory  Result Value Ref Range Status   Adenovirus NOT DETECTED NOT DETECTED Final   Coronavirus 229E NOT DETECTED NOT DETECTED Final    Comment: (NOTE) The Coronavirus on the Respiratory Panel, DOES NOT test for the novel  Coronavirus (2019 nCoV)    Coronavirus HKU1 NOT DETECTED NOT DETECTED Final   Coronavirus NL63 NOT DETECTED NOT DETECTED Final   Coronavirus OC43 NOT DETECTED NOT DETECTED Final  Metapneumovirus NOT DETECTED NOT DETECTED Final   Rhinovirus / Enterovirus NOT DETECTED NOT DETECTED Final   Influenza A NOT DETECTED NOT DETECTED Final   Influenza B NOT DETECTED NOT DETECTED Final   Parainfluenza Virus 1 NOT DETECTED NOT DETECTED Final   Parainfluenza Virus 2 NOT  DETECTED NOT DETECTED Final   Parainfluenza Virus 3 NOT DETECTED NOT DETECTED Final   Parainfluenza Virus 4 NOT DETECTED NOT DETECTED Final   Respiratory Syncytial Virus DETECTED (A) NOT DETECTED Final   Bordetella pertussis NOT DETECTED NOT DETECTED Final   Bordetella Parapertussis NOT DETECTED NOT DETECTED Final   Chlamydophila pneumoniae NOT DETECTED NOT DETECTED Final   Mycoplasma pneumoniae NOT DETECTED NOT DETECTED Final    Comment: Performed at [facility], [place] [address], [place], [place] [address]     Time coordinating discharge: Over 30 minutes  SIGNED:   Sidney Ace, MD  [facility] 10/05/2022, 1:31 PM Pager   If 7PM-7AM, please contact night-coverage

## 2022-10-05 NOTE — Care Management Important Message (Signed)
Important Message  Patient Details  Name: TREMELL REIMERS MRN: [id number] Date of Birth: Mar 14, 1946   Medicare Important Message Given:  Yes     Dannette Barbara 10/05/2022, 10:28 AM

## 2022-10-05 NOTE — Progress Notes (Signed)
Mobility Specialist - Progress Note   10/05/22 0917  Mobility  Activity Ambulated independently in hallway  Level of Assistance Independent  Assistive Device None  Distance Ambulated (ft) 160 ft  Activity Response Tolerated well  Mobility Referral Yes  $Mobility charge 1 Mobility   Pt sitting in recliner on 2.5L upon arrival. Pt STS and ambulates 1 lap around NS indep. Pt returns to recliner with needs in reach.   Pre-mobility: HR 86 SpO2 95  During mobility: HR 94 SpO2 93  Post-mobility: HR 91 SPO2 94   [facility]  Mobility Specialist  10/05/22 9:19 AM

## 2022-10-07 LAB — CULTURE, BLOOD (ROUTINE X 2)
Culture: NO GROWTH
Culture: NO GROWTH
Special Requests: ADEQUATE
Special Requests: ADEQUATE

## 2023-01-31 ENCOUNTER — Other Ambulatory Visit: Payer: Self-pay | Admitting: Family Medicine

## 2023-01-31 DIAGNOSIS — M4807 Spinal stenosis, lumbosacral region: Secondary | ICD-10-CM

## 2023-02-02 ENCOUNTER — Other Ambulatory Visit: Payer: Medicare Other

## 2023-02-04 ENCOUNTER — Ambulatory Visit
Admission: RE | Admit: 2023-02-04 | Discharge: 2023-02-04 | Disposition: A | Discharge status: Home / Self Care | Payer: Medicare Other | Source: Ambulatory Visit | Attending: Family Medicine | Admitting: Family Medicine

## 2023-02-04 DIAGNOSIS — M4807 Spinal stenosis, lumbosacral region: Secondary | ICD-10-CM

## 2023-02-15 ENCOUNTER — Other Ambulatory Visit: Payer: Self-pay | Admitting: Family Medicine

## 2023-02-15 DIAGNOSIS — S32030A Wedge compression fracture of third lumbar vertebra, initial encounter for closed fracture: Secondary | ICD-10-CM

## 2023-02-17 ENCOUNTER — Ambulatory Visit
Admission: RE | Admit: 2023-02-17 | Discharge: 2023-02-17 | Disposition: A | Discharge status: Home / Self Care | Payer: Medicare Other | Source: Ambulatory Visit | Attending: Family Medicine | Admitting: Family Medicine

## 2023-02-17 ENCOUNTER — Other Ambulatory Visit: Payer: Self-pay | Admitting: Interventional Radiology

## 2023-02-17 ENCOUNTER — Other Ambulatory Visit: Payer: Self-pay

## 2023-02-17 DIAGNOSIS — S32030A Wedge compression fracture of third lumbar vertebra, initial encounter for closed fracture: Secondary | ICD-10-CM

## 2023-02-17 DIAGNOSIS — S32030G Wedge compression fracture of third lumbar vertebra, subsequent encounter for fracture with delayed healing: Secondary | ICD-10-CM

## 2023-02-17 NOTE — Consult Note (Addendum)
Chief Complaint: Patient was seen in consultation today for  Painful Lumbar compression fracture at the request of Meeler,Whitney L  Referring Physician(s): Meeler,Whitney L  Supervising Physician: Oley BalmHassell, Sarinah Doetsch  Patient Status: DRI-A Outpatient  History of Present Illness: Samuel Anthony is a 77 y.o. male With an 8-10y history of chronic low back pain, status post lumbar injections and nerve root ablation At an outside facility, who suffered a fall in January with exacerbation of his lower lumbar pain.  His pain persists.  This does not respond to Tylenol.  He is unable to use narcotic medicines because of Exacerbation of dizziness related to A previous stroke.  He rates his pain 8-9 out of 10 on the visual analog pain scale at its worst, improved to 3 out of 10 when sitting.   He scores 19 out of 24 on the [employer] disability questionnaire. Exacerbating factors include lifting, standing, bending forward, walking.  No history of previous lumbar compression fracture. He is on Eliquis because of a history of atrial fibrillation, currently relatively well-controlled. No previous bone mineral density testing to assess for osteoporosis or osteopenia.  Past Medical History:  Diagnosis Date  . Arthritis   . Asthma   . Diabetes mellitus without complication (HCC)   . Dyspnea   . Headache   . Hypertension   . Hypothyroidism   . Stroke [facility](HCC)     Past Surgical History:  Procedure Laterality Date  . PITUITARY SURGERY    . TEE WITHOUT CARDIOVERSION N/A 08/03/2022   Procedure: TRANSESOPHAGEAL ECHOCARDIOGRAM (TEE);  Surgeon: Lamar BlinksKowalski, Bruce J, MD;  Location: [facility] ORS;  Service: Cardiovascular;  Laterality: N/A;  . TONSILLECTOMY      Allergies: Codeine, Oxycodone-acetaminophen, Atorvastatin, Fenofibrate micronized, Levofloxacin, and Tricor [fenofibrate]  Medications: Prior to Admission medications   Medication Sig Start Date End Date Taking? Authorizing Provider  albuterol  (VENTOLIN HFA) 108 (90 Base) MCG/ACT inhaler Inhale 1 puff into the lungs every 6 (six) hours as needed for wheezing or shortness of breath. 10/05/22  Yes Sreenath, Sudheer B, MD  alfuzosin (UROXATRAL) 10 MG 24 hr tablet Take 10 mg by mouth daily with breakfast.   Yes [provider]  apixaban (ELIQUIS) 2.5 MG TABS tablet Take 2.5 mg by mouth 2 (two) times daily.   Yes [provider]  ezetimibe (ZETIA) 10 MG tablet Take 10 mg by mouth daily.   Yes [provider]  fluticasone (FLONASE) 50 MCG/ACT nasal spray Place into both nostrils daily.   Yes [provider]  JARDIANCE 10 MG TABS tablet Take 10 mg by mouth daily.   Yes [provider]  levothyroxine (SYNTHROID) 100 MCG tablet Take 100 mcg by mouth daily before breakfast.   Yes [provider]  liothyronine (CYTOMEL) 5 MCG tablet Take 5 mcg by mouth daily.   Yes [provider]  metoprolol succinate (TOPROL-XL) 50 MG 24 hr tablet Take 50 mg by mouth daily. Take with or immediately following a meal.   Yes [provider]  rosuvastatin (CRESTOR) 5 MG tablet Take 5 mg by mouth 3 (three) times a week.   Yes [provider]  amLODipine (NORVASC) 10 MG tablet Take 1 tablet by mouth daily. Patient not taking: Reported on 02/17/2023 09/02/22   [provider]  carvedilol (COREG) 12.5 MG tablet Take 12.5 mg by mouth 2 (two) times daily with a meal. Patient not taking: Reported on 02/17/2023 08/12/22 08/12/23  [provider]  clopidogrel (PLAVIX)  75 MG tablet Take 75 mg by mouth daily. Patient not taking: Reported on 02/17/2023    [provider]  Homeopathic Products (FRANKINCENSE UPLIFTING) OIL Inhale 1 Tube into the lungs 1 day or 1 dose.    [provider]  irbesartan-hydrochlorothiazide (AVALIDE) 300-12.5 MG tablet Take 1 tablet by mouth daily. Patient not taking: Reported on 02/17/2023    [provider]  Boris LownKrill Oil 350 MG CAPS Take 1  tablet by mouth 1 day or 1 dose.    [provider]  Multiple Vitamin (MULTIVITAMIN) capsule Take 1 capsule by mouth daily.    [provider]  NON [facility] Blue mag    [provider]  Turmeric (QC TUMERIC COMPLEX PO) Take 350 mg by mouth 1 day or 1 dose.    [provider]     No family history on file.  Social History   Socioeconomic History  . Marital status: Married    Spouse name: Not on file  . Number of children: Not on file  . Years of education: Not on file  . Highest education level: Not on file  Occupational History  . Not on file  Tobacco Use  . Smoking status: Former    Types: Cigarettes  . Smokeless tobacco: Never  Substance and Sexual Activity  . Alcohol use: Yes    Alcohol/week: 7.0 standard drinks of alcohol    Types: 7 Shots of liquor per week  . Drug use: Never  . Sexual activity: Not on file  Other Topics Concern  . Not on file  Social History Narrative  . Not on file   Social Determinants of Health   Financial Resource Strain: Not on file  Food Insecurity: No Food Insecurity (10/03/2022)   Hunger Vital Sign   . Worried About Programme researcher, broadcasting/film/videounning Out of Food in the Last Year: Never true   . Ran Out of Food in the Last Year: Never true  Transportation Needs: No Transportation Needs (10/03/2022)   PRAPARE - Transportation   . Lack of Transportation (Medical): No   . Lack of Transportation (Non-Medical): No  Physical Activity: Not on file  Stress: Not on file  Social Connections: Not on file    ECOG Status: 2 - Symptomatic, <50% confined to bed  Review of Systems  Review of Systems: A 12 point ROS discussed and pertinent positives are indicated in the HPI above.  All other systems are negative.  Vital Signs: BP 139/70 (BP Location: Left Arm, Patient Position: Sitting, Cuff Size: Normal)   Pulse 66   Temp 98.1 F (36.7 C) (Oral)   Resp 14   SpO2 94%  Physical Exam Constitutional: Oriented to person, place, and  time. Well-developed and well-nourished. No distress.   HENT:  Head: Normocephalic and atraumatic.  Eyes: Conjunctivae and EOM are normal. Right eye exhibits no discharge. Left eye exhibits no discharge. No scleral icterus.  Neck: No JVD present.  Pulmonary/Chest: Effort normal. No stridor. No respiratory distress.  Abdomen: soft, non distended Neurological:  alert and oriented to person, place, and time.  Skin: Skin is warm and dry.  not diaphoretic.  Psychiatric:   normal mood and affect.   behavior is normal. Judgment and thought content normal.  Musculoskeletal: Tenderness over the lower lumbar spine.  Ambulates with an antalgic gait.  Imaging: MR LUMBAR SPINE WO CONTRAST  Result Date: 02/04/2023 CLINICAL DATA:  Chronic and worsening low back pain EXAM: MRI LUMBAR SPINE WITHOUT CONTRAST TECHNIQUE: Multiplanar, multisequence MR imaging of  the lumbar spine was performed. No intravenous contrast was administered. COMPARISON:  None Available. FINDINGS: Segmentation: The lowest lumbar type non-rib-bearing vertebra is labeled as L5. Alignment:  3 mm of grade 1 degenerative anterolisthesis at L2-3. Vertebrae: Subacute compression and/or broad Schmorl's node along the superior endplate of L3 with up to 35% loss of vertebral body height and mild edema along the rounded central Schmorl's node. Type 1 degenerative endplate findings along the posteroinferior endplate of L2. Disc desiccation at all levels between L2 and S1 with loss of intervertebral disc height at L4-5 and L5-S1. Degenerative facet edema on the left at L4-5 and L5-S1. Conus medullaris and cauda equina: Conus extends to the L1 level. Conus and cauda equina appear normal. Paraspinal and other soft tissues: Unremarkable Disc levels: T12-L1: Unremarkable L1-2: Unremarkable L2-3: Borderline central narrowing of the thecal sac due to facet arthropathy, ligamentum flavum redundancy, disc bulge, and mild chronic posterior bony retropulsion along the  posterosuperior endplate of L3. Borderline left subarticular lateral recess stenosis. L3-4: Moderate central narrowing of the thecal sac with mild bilateral subarticular lateral recess stenosis due to degenerative facet arthropathy and diffuse disc bulge. L4-5: Mild bilateral foraminal stenosis and borderline bilateral subarticular lateral recess stenosis due to disc bulge and facet arthropathy. L5-S1: Mild bilateral foraminal stenosis and mild bilateral subarticular lateral recess stenosis due to disc bulge and facet arthropathy. IMPRESSION: 1. Lumbar spondylosis and degenerative disc disease, causing moderate impingement at L3-4; mild impingement at L4-5 and L5-S1; and borderline impingement at L2-3. 2. Subacute compression and/or broad Schmorl's node along the superior endplate of L3 with up to 35% loss of vertebral body height. Electronically Signed   By: Gaylyn Rong M.D.   On: 02/04/2023 14:57    Labs:  CBC: Recent Labs    10/02/22 2027 10/03/22 0253  WBC 10.1 10.1  HGB 16.2 16.3  HCT 44.8 45.5  PLT 237 218    COAGS: Recent Labs    10/02/22 2027  INR 1.1    BMP: Recent Labs    10/02/22 2027 10/03/22 0253 10/04/22 0437 10/05/22 0447  NA 130* 129* 135 137  K 4.1 4.4 4.4 4.6  CL 92* 91* 101 102  CO2 27 26 26 29   GLUCOSE 141* 179* 184* 146*  BUN 36* 35* 39* 39*  CALCIUM 9.1 8.9 8.7* 9.0  CREATININE 1.43* 1.56* 1.29* 1.27*  GFRNONAA 51* 46* 58* 59*    LIVER FUNCTION TESTS: Recent Labs    10/02/22 2027  BILITOT 1.1  AST 38  ALT 52*  ALKPHOS 97  PROT 7.7  ALBUMIN 4.5    TUMOR MARKERS: No results for input(s): "AFPTM", "CEA", "CA199", "CHROMGRNA" in the last 8760 hours.  Assessment and Plan:   Patient has suffered subacute  traumatic fracture of the Lumbar L3 vertebra.   History and exam have demonstrated the following:  Acute/Subacute fracture by imaging dated 02/04/2023, Pain on exam concordant with level of fracture, Inability to tolerate narcotic  pain medication due to side effects, and Significant disability on the [employer] Disability Questionnaire with 19/24 positive symptoms, reflecting significant impact/impairment of (ADLs)   ICD-10-CM Codes that Support Medical Necessity (WelshBlog.at.[email or web address]?articleId=[email or web address])  S32.030A    Wedge compression fracture of third lumbar vertebra, initial encounter for closed fracture    I discussed with the patient  and spouse the pathophysiology of vertebral compression fracture deformities; the stable nature of these which does not require emergent treatment; natural history which includes healing over some unpredictable number of months.  We discussed  treatment options including watchful waiting, surgical fixation, and percutaneous kyphoplasty/vertebroplasty.  We discussed in detail the percutaneous kyphoplasty technique, anticipated benefits, time course to symptom resolution, possible risks and side effects.  We discussed his elevated risk of additional level fractures with or without vertebral augmentation.  We discussed the long-term need for continued bone building therapy managed by the patient's PCP.   They seemed to understand, and did ask appropriate questions. The patient is motivated to proceed with treatment ASAP.     Plan:  Lumbar L3 vertebral body augmentation with balloon kyphoplasty  Post-procedure disposition: outpatient DRI-A or as availabe  Medication holds: Eliquis per protocol  The patient has suffered a fracture of the Lumbar L3 vertebral body. It is recommended that patients aged 70 years or older be evaluated for possible testing or treatment of osteoporosis. A copy of this consult report is sent to the patient's referring physician.  Advanced Care Plan: The patient did not want to provide an Advanced Care Plan at the time of this visit     Total time spent on today's visit was over  40 Minutes , including both face-to-face time  and non face-to-face time, personally spent on review of chart (including labs and relevant imaging), discussing further workup and treatment options, referral to specialist if needed, reviewing outside records if pertinent, answering patient questions, and coordinating care regarding Painful subacute lumbar L3 fracture as well as management strategy.    Thank you for this interesting consult.  I greatly enjoyed meeting [facility] ORSAK and look forward to participating in their care.  A copy of this report was sent to the requesting provider on this date.  Electronically Signed: Durwin Glaze, MD 02/17/2023, 11:47 AM

## 2023-02-21 ENCOUNTER — Ambulatory Visit
Admission: RE | Admit: 2023-02-21 | Discharge: 2023-02-21 | Disposition: A | Discharge status: Home / Self Care | Payer: Medicare Other | Source: Ambulatory Visit | Attending: Family Medicine | Admitting: Family Medicine

## 2023-02-21 ENCOUNTER — Other Ambulatory Visit: Payer: Medicare Other

## 2023-02-21 DIAGNOSIS — S32030A Wedge compression fracture of third lumbar vertebra, initial encounter for closed fracture: Secondary | ICD-10-CM

## 2023-02-21 HISTORY — PX: IR KYPHO LUMBAR INC FX REDUCE BONE BX UNI/BIL CANNULATION INC/IMAGING: IMG5519

## 2023-02-21 MED ORDER — FENTANYL CITRATE PF 50 MCG/ML IJ SOSY
25.0000 ug | PREFILLED_SYRINGE | INTRAMUSCULAR | Status: DC | PRN
  Administered 2023-02-21 (×2): 50 ug via INTRAVENOUS

## 2023-02-21 MED ORDER — KETOROLAC TROMETHAMINE 30 MG/ML IJ SOLN: 30.0000 mg | Freq: Once | INTRAMUSCULAR | Status: DC

## 2023-02-21 MED ORDER — MIDAZOLAM HCL 2 MG/2ML IJ SOLN
1.0000 mg | INTRAMUSCULAR | Status: DC | PRN
  Administered 2023-02-21 (×2): 1 mg via INTRAVENOUS

## 2023-02-21 MED ORDER — SODIUM CHLORIDE 0.9 % IV SOLN: INTRAVENOUS | Status: DC

## 2023-02-21 MED ORDER — CEFAZOLIN SODIUM-DEXTROSE 2-4 GM/100ML-% IV SOLN
2.0000 g | INTRAVENOUS | Status: AC
  Administered 2023-02-21: 2 g via INTRAVENOUS

## 2023-02-21 NOTE — Progress Notes (Signed)
Pt back in nursing recovery area. Pt still drowsy from procedure but will wake up when spoken to. Pt follows commands, talks in complete sentences and has no complaints at this time. Pt will remain in nursing station until discharge.  ?

## 2023-02-21 NOTE — Discharge Instructions (Addendum)
Kyphoplasty Post Procedure Discharge Instructions  May resume a regular diet and any medications that you routinely take (including pain medications). However, if you are taking Aspirin or an anticoagulant/blood thinner you will be told when you can resume taking these by the healthcare provider. No driving day of procedure. The day of your procedure take it easy. You may use an ice pack as needed to injection sites on back.  Ice to back 30 minutes on and 30 minutes off, as needed. May remove bandaids tomorrow after taking a shower. Replace daily with a clean bandaid until healed.  Do not lift anything heavier than a milk jug for 1-2 weeks or determined by your physician.  Follow up with your physician in 2 weeks.    Please contact our office at [phone] for the following symptoms or if you have any questions:  Fever greater than 100 degrees Increased swelling, pain, or redness at injection site. Increased back and/or leg pain New numbness or change in symptoms from before the procedure.    Thank you for visiting [facility].  May resume eliquis 24 hours post procedure!

## 2023-02-28 ENCOUNTER — Other Ambulatory Visit: Payer: Self-pay | Admitting: Physician Assistant

## 2023-02-28 ENCOUNTER — Ambulatory Visit
Admission: RE | Admit: 2023-02-28 | Discharge: 2023-02-28 | Disposition: A | Discharge status: Home / Self Care | Payer: Medicare Other | Source: Ambulatory Visit | Attending: Physician Assistant | Admitting: Physician Assistant

## 2023-02-28 DIAGNOSIS — M79662 Pain in left lower leg: Secondary | ICD-10-CM

## 2023-03-06 ENCOUNTER — Other Ambulatory Visit: Payer: Self-pay | Admitting: Interventional Radiology

## 2023-03-06 ENCOUNTER — Telehealth: Payer: Self-pay

## 2023-03-06 DIAGNOSIS — S32030D Wedge compression fracture of third lumbar vertebra, subsequent encounter for fracture with routine healing: Secondary | ICD-10-CM

## 2023-03-06 NOTE — Telephone Encounter (Signed)
Phone call to pt to follow up from his kyphoplasty on 02/21/23. Pt reports his pain is completely gone post procedure. Pt reports she is able to move around a little better. Pt denies any signs of infection, redness at the site, draining or fever. Pt has no complaints at this time and will be scheduled for a telephone follow up with Dr. Archer Asa next week. Pt advised to call back if anything were to change or any concerns arise and we will arrange an in person appointment. Pt verbalized understanding.

## 2023-03-09 ENCOUNTER — Ambulatory Visit
Admission: RE | Admit: 2023-03-09 | Discharge: 2023-03-09 | Disposition: A | Discharge status: Home / Self Care | Payer: Medicare Other | Source: Ambulatory Visit | Attending: Interventional Radiology | Admitting: Interventional Radiology

## 2023-03-09 DIAGNOSIS — S32030D Wedge compression fracture of third lumbar vertebra, subsequent encounter for fracture with routine healing: Secondary | ICD-10-CM

## 2023-03-09 HISTORY — PX: IR RADIOLOGIST EVAL & MGMT: IMG5224

## 2023-03-09 NOTE — Progress Notes (Signed)
Chief Complaint: Patient was consulted remotely today (TeleHealth) for L3 compression fracture at the request of Tranice Laduke K.    Referring Physician(s): Zebadiah Willert K  History of Present Illness: Samuel Anthony is a 77 y.o. male With a history of osteoporotic L3 compression fracture status post cement augmentation with balloon kyphoplasty on 02/21/2023.  I called to speak to the patient today but was only able to speak to his wife.  She assured me that Samuel Anthony is doing much better.  His pain is essentially completely resolved and he is extremely pleased with his result.  They have no complaints at this time.  Past Medical History:  Diagnosis Date   Arthritis    Asthma    Diabetes mellitus without complication    Dyspnea    Headache    Hypertension    Hypothyroidism    Stroke     Past Surgical History:  Procedure Laterality Date   IR KYPHO LUMBAR INC FX REDUCE BONE BX UNI/BIL CANNULATION INC/IMAGING  02/21/2023   PITUITARY SURGERY     TEE WITHOUT CARDIOVERSION N/A 08/03/2022   Procedure: TRANSESOPHAGEAL ECHOCARDIOGRAM (TEE);  Surgeon: Lamar Blinks, MD;  Location: [facility] ORS;  Service: Cardiovascular;  Laterality: N/A;   TONSILLECTOMY      Allergies: Codeine, Oxycodone-acetaminophen, Atorvastatin, Fenofibrate micronized, Levofloxacin, and Tricor [fenofibrate]  Medications: Prior to Admission medications   Medication Sig Start Date End Date Taking? Authorizing Provider  albuterol (VENTOLIN HFA) 108 (90 Base) MCG/ACT inhaler Inhale 1 puff into the lungs every 6 (six) hours as needed for wheezing or shortness of breath. 10/05/22   Tresa Moore, MD  alfuzosin (UROXATRAL) 10 MG 24 hr tablet Take 10 mg by mouth daily with breakfast.    [provider]  amLODipine (NORVASC) 10 MG tablet Take 1 tablet by mouth daily. Patient not taking: Reported on 02/17/2023 09/02/22   [provider]  apixaban (ELIQUIS) 2.5 MG TABS tablet Take 2.5 mg by  mouth 2 (two) times daily.    [provider]  carvedilol (COREG) 12.5 MG tablet Take 12.5 mg by mouth 2 (two) times daily with a meal. Patient not taking: Reported on 02/17/2023 08/12/22 08/12/23  [provider]  clopidogrel (PLAVIX) 75 MG tablet Take 75 mg by mouth daily. Patient not taking: Reported on 02/17/2023    [provider]  ezetimibe (ZETIA) 10 MG tablet Take 10 mg by mouth daily.    [provider]  fluticasone (FLONASE) 50 MCG/ACT nasal spray Place into both nostrils daily.    [provider]  Homeopathic Products (FRANKINCENSE UPLIFTING) OIL Inhale 1 Tube into the lungs 1 day or 1 dose.    [provider]  irbesartan-hydrochlorothiazide (AVALIDE) 300-12.5 MG tablet Take 1 tablet by mouth daily. Patient not taking: Reported on 02/17/2023    [provider]  JARDIANCE 10 MG TABS tablet Take 10 mg by mouth daily.    [provider]  [place] Lanius 350 MG CAPS Take 1 tablet by mouth 1 day or 1 dose.    [provider]  levothyroxine (SYNTHROID) 100 MCG tablet Take 100 mcg by mouth daily before breakfast.    [provider]  liothyronine (CYTOMEL) 5 MCG tablet Take 5 mcg by mouth daily.    [provider]  metoprolol succinate (TOPROL-XL) 50 MG 24 hr tablet Take 50 mg by mouth daily. Take with or immediately following a meal.    [provider]  Multiple Vitamin (MULTIVITAMIN) capsule Take 1 capsule  by mouth daily.    [provider]  NON [facility] mag    [provider]  rosuvastatin (CRESTOR) 5 MG tablet Take 5 mg by mouth 3 (three) times a week.    [provider]  Turmeric (QC TUMERIC COMPLEX PO) Take 350 mg by mouth 1 day or 1 dose.    [provider]     No family history on file.  Social History   Socioeconomic History   Marital status: Married    Spouse name: Not on file   Number of children: Not on file   Years of education: Not on  file   Highest education level: Not on file  Occupational History   Not on file  Tobacco Use   Smoking status: Former    Types: Cigarettes   Smokeless tobacco: Never  Substance and Sexual Activity   Alcohol use: Yes    Alcohol/week: 7.0 standard drinks of alcohol    Types: 7 Shots of liquor per week   Drug use: Never   Sexual activity: Not on file  Other Topics Concern   Not on file  Social History Narrative   Not on file   Social Determinants of Health   Financial Resource Strain: Not on file  Food Insecurity: No Food Insecurity (10/03/2022)   Hunger Vital Sign    Worried About Running Out of Food in the Last Year: Never true    Ran Out of Food in the Last Year: Never true  Transportation Needs: No Transportation Needs (10/03/2022)   PRAPARE - Administrator, Civil Service (Medical): No    Lack of Transportation (Non-Medical): No  Physical Activity: Not on file  Stress: Not on file  Social Connections: Not on file    Review of Systems  Review of Systems: A 12 point ROS discussed and pertinent positives are indicated in the HPI above.  All other systems are negative.   Physical Exam No direct physical exam was performed (except for noted visual exam findings with Video Visits).    Vital Signs: There were no vitals taken for this visit.  Imaging: US Venous Img Lower Unilateral Left (DVT)  Result Date: 02/28/2023 CLINICAL DATA:  pain in left lower leg EXAM: LEFT LOWER EXTREMITY VENOUS DOPPLER ULTRASOUND TECHNIQUE: Gray-scale sonography with compression, as well as color and duplex ultrasound, were performed to evaluate the deep venous system(s) from the level of the common femoral vein through the popliteal and proximal calf veins. COMPARISON:  None Available. FINDINGS: VENOUS Normal compressibility of the common femoral, superficial femoral, and popliteal veins, as well as the visualized calf veins. Visualized portions of profunda femoral vein and great  saphenous vein unremarkable. No filling defects to suggest DVT on grayscale or color Doppler imaging. Doppler waveforms show normal direction of venous flow, normal respiratory plasticity and response to augmentation. Limited views of the contralateral common femoral vein are unremarkable. OTHER No evidence of superficial thrombophlebitis or abnormal fluid collection. Limitations: none IMPRESSION: No evidence of femoropopliteal DVT or superficial thrombophlebitis within the LEFT lower extremity. Roanna Banning, MD [facility]iology Electronically Signed   By: Roanna Banning M.D.   On: 02/28/2023 15:29   IR KYPHO LUMBAR INC FX REDUCE BONE BX UNI/BIL CANNULATION INC/IMAGING  Result Date: 02/21/2023 CLINICAL DATA:  77 year old male with subacute symptomatic osteoporotic compression fracture of L3. He presents today for kyphoplasty. EXAM: FLUOROSCOPIC GUIDED KYPHOPLASTY OF THE L3 VERTEBRAL BODY COMPARISON:  None Available. MEDICATIONS: As  antibiotic prophylaxis, 2g Ancef was ordered pre-procedure and administered intravenously within 1 hour of incision. ANESTHESIA/SEDATION: Moderate (conscious) sedation was employed during this procedure. A total of Versed 20 mg and Fentanyl 100 mcg was administered intravenously. Moderate Sedation Time: 28 minutes. The patient's level of consciousness and vital signs were monitored continuously by radiology nursing throughout the procedure under my direct supervision. FLUOROSCOPY TIME:  Radiation Exposure Index: 51.6 mGy reference air Kerma COMPLICATIONS: None immediate. PROCEDURE: The procedure, risks (including but not limited to bleeding, infection, organ damage), benefits, and alternatives were explained to the patient. Questions regarding the procedure were encouraged and answered. The patient understands and consents to the procedure. The patient was placed prone on the fluoroscopic table. The skin overlying the upper thoracic  region was then prepped and draped in the usual sterile fashion. Maximal barrier sterile technique was utilized including caps, mask, sterile gowns, sterile gloves, sterile drape, hand hygiene and skin antiseptic. Intravenous Fentanyl and Versed were administered as conscious sedation during continuous cardiorespiratory monitoring by the radiology RN. The right pedicle at L3 was then infiltrated with 1% lidocaine followed by the advancement of a Kyphon trocar needle through the left pedicle into the posterior one-third of the vertebral body. Subsequently, the osteo drill was advanced to the anterior third of the vertebral body. The osteo drill was retracted. Through the working cannula, a Kyphon inflatable bone tamp x 2.5 was advanced and positioned with the distal marker approximately 5 mm from the anterior aspect of the cortex. Appropriate positioning was confirmed on the AP projection. At this time, the balloon was expanded using contrast via a Kyphon inflation syringe device via micro tubing. Inflation was continued until there was near apposition with the superior end plate. At this time, methylmethacrylate mixture was reconstituted in the Kyphon bone mixing device system. This was then loaded into the delivery mechanism, attached to Kyphon bone fillers. The balloons were deflated and removed followed by the instillation of methylmethacrylate mixture with excellent filling in the AP and lateral projections. No extravasation was noted in the disk spaces or posteriorly into the spinal canal. No epidural venous contamination was seen. The working cannulae and the bone filler were then retrieved and removed. Hemostasis was achieved with manual compression. The patient tolerated the procedure well without immediate postprocedural complication. IMPRESSION: 1. Technically successful L3 vertebral body augmentation using balloon kyphoplasty. 2. Per CMS PQRS reporting requirements (PQRS Measure 24): Given the patient's  age of greater than 50 and the fracture site (hip, distal radius, or spine), the patient should be tested for osteoporosis using DXA, and the appropriate treatment considered based on the DXA results. Electronically Signed   By: Malachy Moan M.D.   On: 02/21/2023 13:33    Labs:  CBC: Recent Labs    10/02/22 2027 10/03/22 0253  WBC 10.1 10.1  HGB 16.2 16.3  HCT 44.8 45.5  PLT 237 218    COAGS: Recent Labs    10/02/22 2027  INR 1.1    BMP: Recent Labs    10/02/22 2027 10/03/22 0253 10/04/22 0437 10/05/22 0447  NA 130* 129* 135 137  K 4.1 4.4 4.4 4.6  CL 92* 91* 101 102  CO2 GLUCOSE 141* 179* 184* 146*  BUN 36* 35* 39* 39*  CALCIUM 9.1 8.9 8.7* 9.0  CREATININE 1.43* 1.56* 1.29* 1.27*  GFRNONAA 51* 46* 58* 59*    LIVER FUNCTION TESTS: Recent Labs    10/02/22 2027  BILITOT 1.1  AST 38  ALT 52*  ALKPHOS 97  PROT 7.7  ALBUMIN 4.5    TUMOR MARKERS: No results for input(s): "AFPTM", "CEA", "CA199", "CHROMGRNA" in the last 8760 hours.  Assessment and Plan:  Very pleasant 77 year old gentleman doing exceptionally well 2 weeks status post L3 kyphoplasty.  He is essentially completely recovered and very happy with his result.  No further follow-up.     Electronically Signed: Sterling Big 03/09/2023, 3:42 PM   I spent a total of  5 Minutes in remote  clinical consultation, greater than 50% of which was counseling/coordinating care for L3 compression fracture.    Visit type: Audio only (telephone). Audio (no video) only due to patient preference. Alternative for in-person consultation at [facility], [address] [place], [place], [place]. This visit type was conducted due to national recommendations for restrictions regarding the COVID-19 Pandemic (e.g. social distancing).  This format is felt to be most appropriate for this patient at this time.  All issues noted in this document were discussed and addressed.

## 2023-04-23 IMAGING — US US EXTREM LOW VENOUS*L*
1 series · 15 of 24 positions shown · non-contrast
Comparison: None.

CLINICAL DATA: 76-year-old male with left lower extremity swelling.

EXAM:
LEFT LOWER EXTREMITY VENOUS DOPPLER ULTRASOUND
TECHNIQUE: Gray-scale sonography with graded compression, as well as color
Doppler and duplex ultrasound were performed to evaluate the left
lower extremity deep venous systems from the level of the common
femoral vein and including the common femoral, femoral, profunda
femoral, popliteal and calf veins including the posterior tibial,
peroneal and gastrocnemius veins when visible. Spectral Doppler was
utilized to evaluate flow at rest and with distal augmentation
maneuvers in the common femoral, femoral and popliteal veins. The
contralateral common femoral vein was also evaluated for comparison.

[Series 1: us venous imag bi/left/(id) · portal-venous · 15 of 34 slices shown]
[im 1/34]
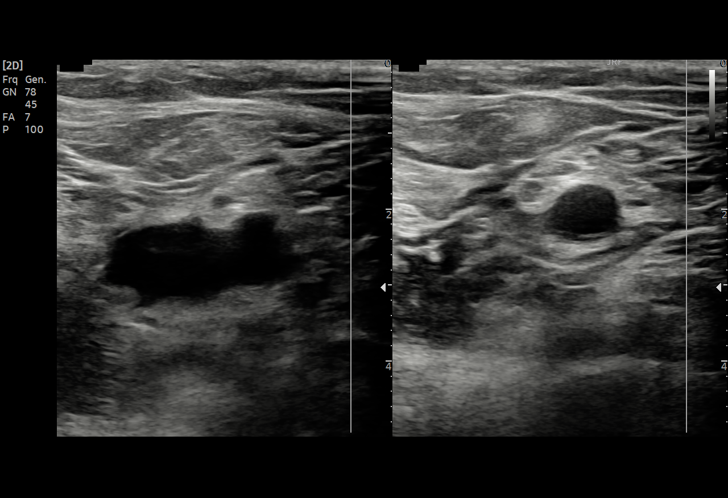
[im 3/34]
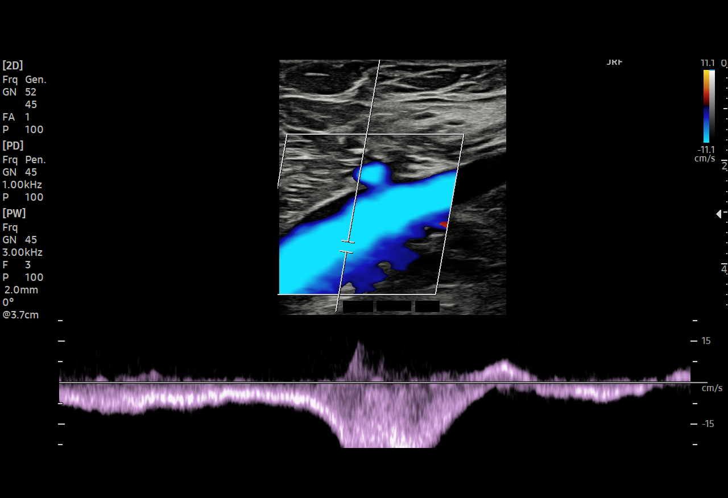
[im 6/34]
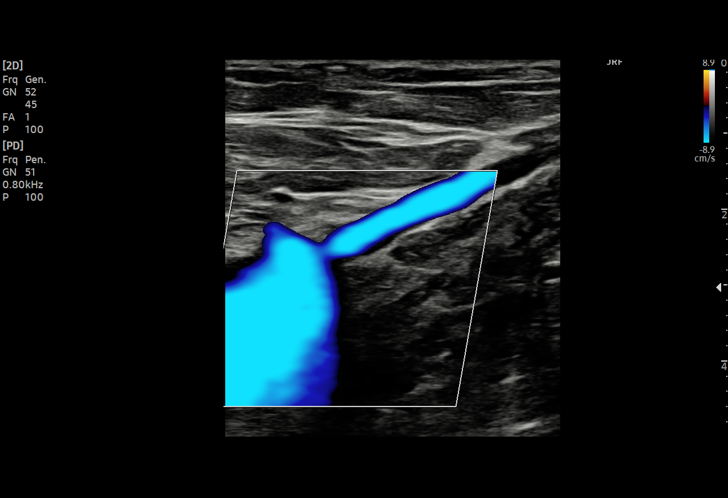
[im 8/34]
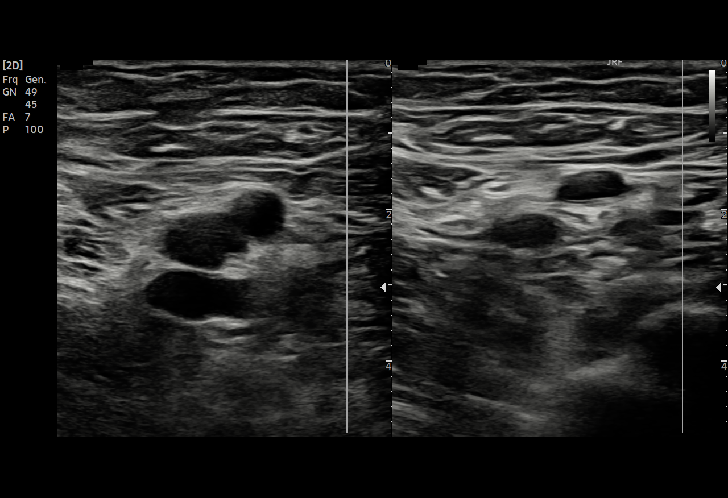
[im 11/34]
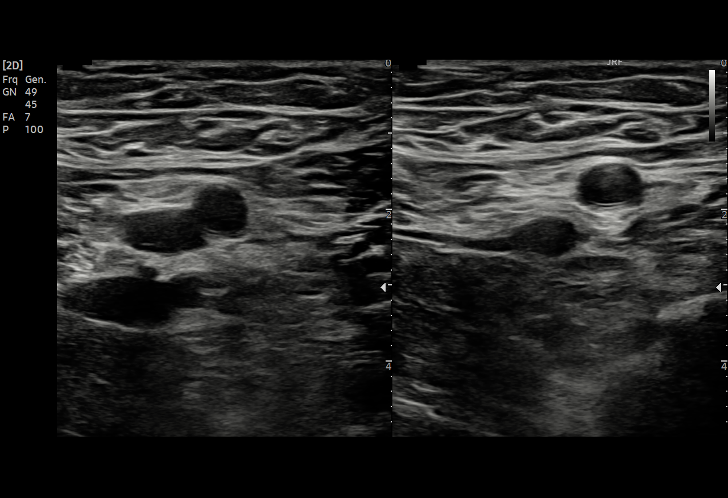
[im 12/34]
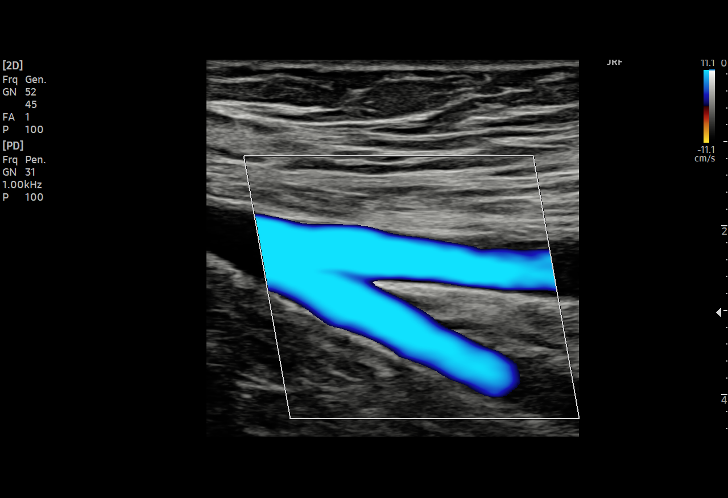
[im 15/34]
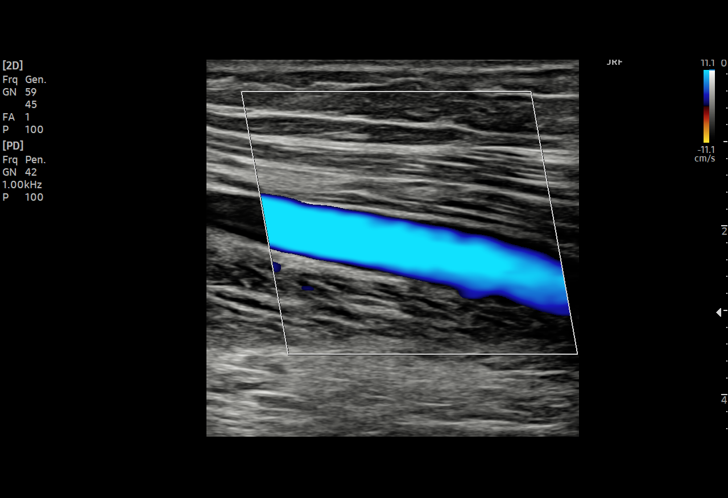
[im 18/34]
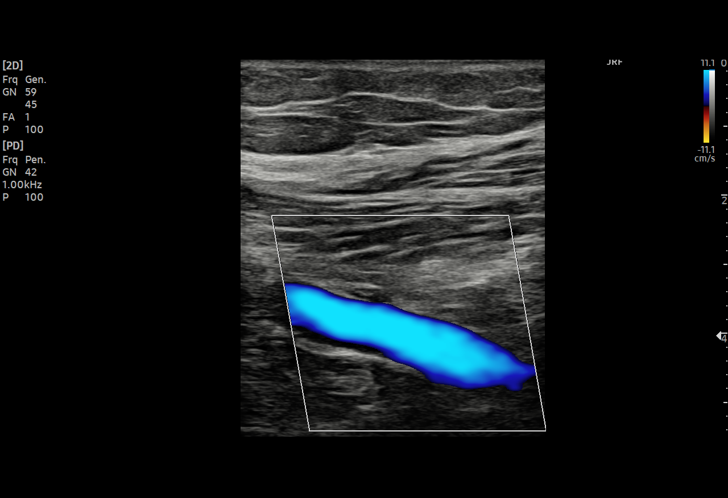
[im 19/34]
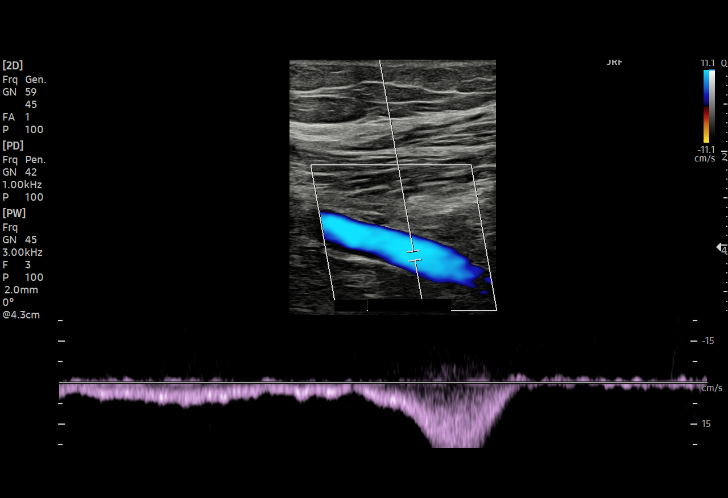
[im 22/34]
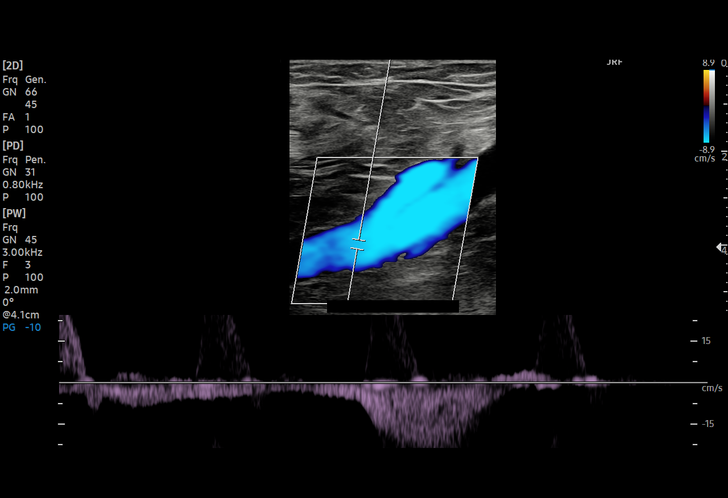
[im 23/34]
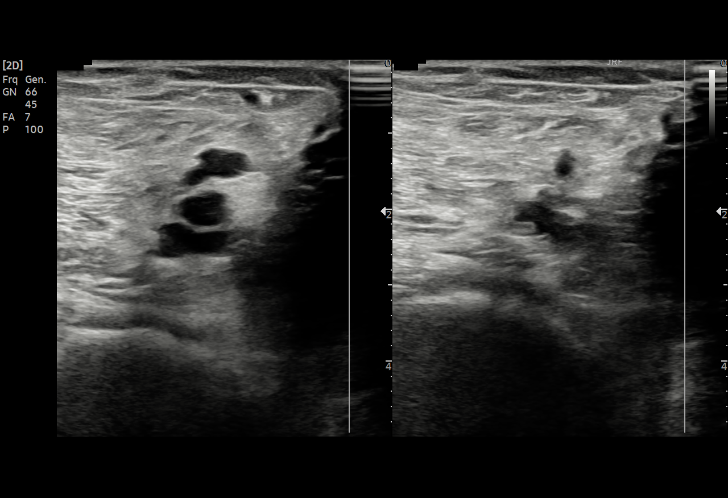
[im 26/34]
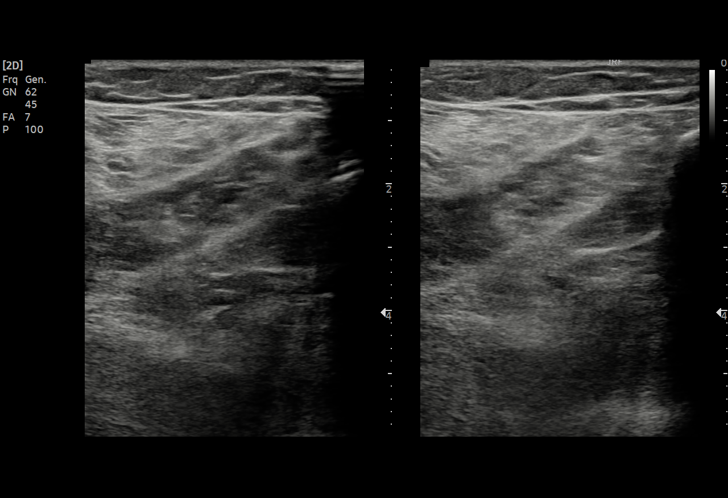
[im 29/34]
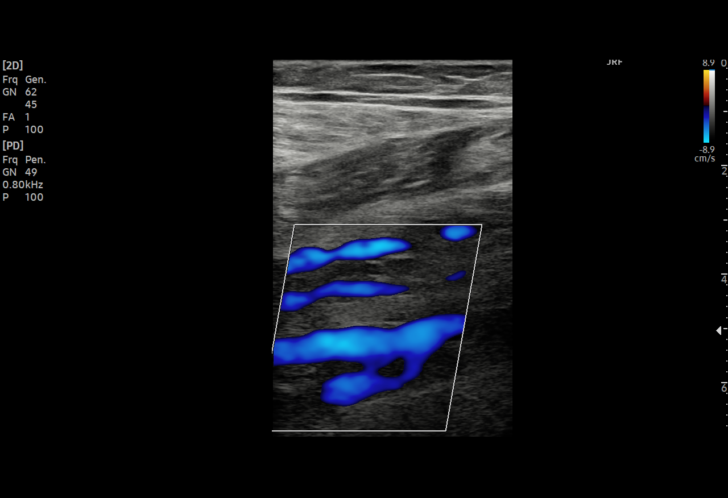
[im 31/34]
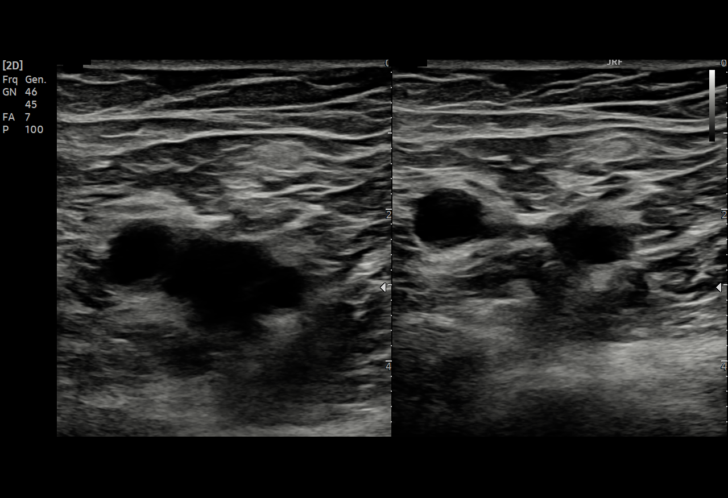
[im 34/34]
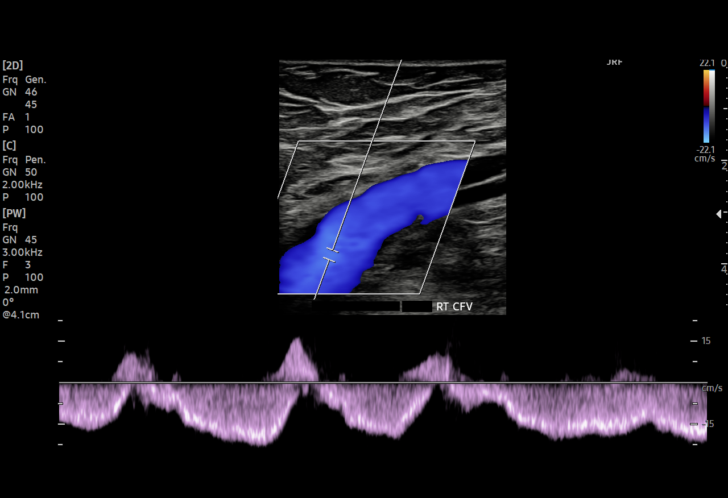

[15 of 24 positions shown; findings below may reference images not displayed]

FINDINGS: LEFT LOWER EXTREMITY

Common Femoral Vein: No evidence of thrombus. Normal
compressibility, respiratory phasicity and response to augmentation.

Central Greater Saphenous Vein: No evidence of thrombus. Normal
compressibility and flow on color Doppler imaging.

Central Profunda Femoral Vein: No evidence of thrombus. Normal
compressibility and flow on color Doppler imaging.

Femoral Vein: No evidence of thrombus. Normal compressibility,
respiratory phasicity and response to augmentation.

Popliteal Vein: No evidence of thrombus. Normal compressibility,
respiratory phasicity and response to augmentation.

Calf Veins: No evidence of thrombus. Normal compressibility and flow
on color Doppler imaging.

Other Findings:  None.

RIGHT LOWER EXTREMITY

Common Femoral Vein: No evidence of thrombus. Normal
compressibility, respiratory phasicity and response to augmentation.
IMPRESSION: No evidence of left lower extremity deep venous thrombosis.

## 2023-08-09 ENCOUNTER — Other Ambulatory Visit: Payer: Self-pay

## 2023-08-09 ENCOUNTER — Emergency Department: Payer: Medicare Other

## 2023-08-09 ENCOUNTER — Emergency Department
Admission: EM | Admit: 2023-08-09 | Discharge: 2023-08-10 | Disposition: A | Discharge status: Home / Self Care | Payer: Medicare Other | Attending: Student in an Organized Health Care Education/Training Program | Admitting: Student in an Organized Health Care Education/Training Program

## 2023-08-09 DIAGNOSIS — S0990XA Unspecified injury of head, initial encounter: Secondary | ICD-10-CM | POA: Insufficient documentation

## 2023-08-09 DIAGNOSIS — Y92009 Unspecified place in unspecified non-institutional (private) residence as the place of occurrence of the external cause: Secondary | ICD-10-CM | POA: Insufficient documentation

## 2023-08-09 DIAGNOSIS — I10 Essential (primary) hypertension: Secondary | ICD-10-CM | POA: Diagnosis not present

## 2023-08-09 DIAGNOSIS — Z7901 Long term (current) use of anticoagulants: Secondary | ICD-10-CM | POA: Diagnosis not present

## 2023-08-09 DIAGNOSIS — W010XXA Fall on same level from slipping, tripping and stumbling without subsequent striking against object, initial encounter: Secondary | ICD-10-CM | POA: Insufficient documentation

## 2023-08-09 DIAGNOSIS — Y93K1 Activity, walking an animal: Secondary | ICD-10-CM | POA: Insufficient documentation

## 2023-08-09 DIAGNOSIS — E119 Type 2 diabetes mellitus without complications: Secondary | ICD-10-CM | POA: Diagnosis not present

## 2023-08-09 DIAGNOSIS — W19XXXA Unspecified fall, initial encounter: Secondary | ICD-10-CM

## 2023-08-09 DIAGNOSIS — S51812A Laceration without foreign body of left forearm, initial encounter: Secondary | ICD-10-CM | POA: Diagnosis not present

## 2023-08-09 DIAGNOSIS — S50811A Abrasion of right forearm, initial encounter: Secondary | ICD-10-CM | POA: Insufficient documentation

## 2023-08-09 DIAGNOSIS — J45909 Unspecified asthma, uncomplicated: Secondary | ICD-10-CM | POA: Diagnosis not present

## 2023-08-09 DIAGNOSIS — I4891 Unspecified atrial fibrillation: Secondary | ICD-10-CM | POA: Insufficient documentation

## 2023-08-09 DIAGNOSIS — S50912A Unspecified superficial injury of left forearm, initial encounter: Secondary | ICD-10-CM | POA: Diagnosis present

## 2023-08-09 MED ORDER — BACITRACIN ZINC 500 UNIT/GM EX OINT
TOPICAL_OINTMENT | Freq: Once | CUTANEOUS | Status: AC
  Administered 2023-08-10: 1 via TOPICAL
  Filled 2023-08-09: qty 1.8

## 2023-08-09 NOTE — Discharge Instructions (Addendum)
Your exam and head CT are normal and reassuring at this time.  Keep the wound clean, dry, and covered with antibiotic ointment and nonstick dressings as discussed.

## 2023-08-09 NOTE — ED Triage Notes (Signed)
Pt presents to ER c/o fall and left arm skin tear that happened aorund 1hr ago.  Pt states he was walking dog, and tripped over the dog, landing on his left arm.  Pt has significant skin tear noted to left upper forearm, with bleeding from site.  Pt states he is on eliquis for a-fib.  Denies hitting head or LOC.  Pt also has abrasion to left knee with bleeding controlled.  Pts left arm wrapped in saline soaked gauze and curlex.  Pt's wife reports pt has also had some alcohol to drink prior to walking the dog.  Pt is otherwise A&O x4 and in NAD at this time.

## 2023-08-09 NOTE — ED Provider Notes (Signed)
[facility] Emergency Department Provider Note     Event Date/Time   First MD Initiated Contact with Patient 08/09/23 2124     (approximate)   History   Fall and Abrasion   HPI  Samuel Anthony is a 77 y.o. male history of HTN, CVA, DM, asthma, and A-fib on Eliquis, presents to the ED for evaluation of injury sustained following mechanical fall.  Patient reports he tripped and fell about an hour prior to arrival.  Walking his dog when tripped over the dog landing on his left forearm.  Patient presents the skin to the left upper arm with no active bleeding noted at this time.  Patient does take Eliquis for A-fib but denies any acute head injury or LOC.  He also admits to drinking alcohol prior to walking the dog.  He would admit to being an every day drinker.  He denies any other injury related to the fall.   Physical Exam   Triage Vital Signs: ED Triage Vitals  Encounter Vitals Group     BP 08/09/23 2051 (!) 147/106     Systolic BP Percentile --      Diastolic BP Percentile --      Pulse Rate 08/09/23 2051 70     Resp 08/09/23 2051 20     Temp 08/09/23 2051 97.9 F (36.6 C)     Temp src --      SpO2 08/09/23 2051 90 %     Weight 08/09/23 2058 186 lb (84.4 kg)     Height 08/09/23 2058 5\' 9"  (1.753 m)     Head Circumference --      Peak Flow --      Pain Score 08/09/23 2056 2     Pain Loc --      Pain Education --      Exclude from Growth Chart --     Most recent vital signs: Vitals:   08/09/23 2051 08/09/23 2350  BP: (!) 147/106 (!) 109/97  Pulse: 70 (!) 57  Resp: 20 19  Temp: 97.9 F (36.6 C)   SpO2: 90% 94%    General Awake, no distress. NAD A&O x 4 HEENT NCAT. PERRL. EOMI. No rhinorrhea. Mucous membranes are moist.  CV:  Good peripheral perfusion. RRR RESP:  Normal effort. CTA ABD:  No distention.  Soft and nontender MSK:  Normal active range of motion of all extremities.  Left dorsal elbow with skin tear noted.   ED  Results / Procedures / Treatments   Labs (all labs ordered are listed, but only abnormal results are displayed) Labs Reviewed - No data to display   EKG   RADIOLOGY  I personally viewed and evaluated these images as part of my medical decision making, as well as reviewing the written report by the radiologist.  ED Provider Interpretation: No acute findings  CT HEAD WO CONTRAST ( )  Result Date: 08/10/2023 CLINICAL DATA:  Status post fall. EXAM: CT HEAD WITHOUT CONTRAST TECHNIQUE: Contiguous axial images were obtained from the base of the skull through the vertex without intravenous contrast. RADIATION DOSE REDUCTION: This exam was performed according to the departmental dose-optimization program which includes automated exposure control, adjustment of the mA and/or kV according to patient size and/or use of iterative reconstruction technique. COMPARISON:  MR Head dated June 22, 2022 FINDINGS: Brain: There is mild cerebral atrophy with widening of the extra-axial spaces and ventricular dilatation. There are areas of decreased attenuation within the white matter  tracts of the supratentorial brain, consistent with microvascular disease changes. 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 Vascular: There is marked severity bilateral cavernous carotid artery calcification. Skull: Normal. Negative for fracture or focal lesion. Sinuses/Orbits: No acute finding. Other: None. IMPRESSION: 1. Generalized cerebral atrophy with chronic white matter small vessel ischemic changes. 2. Stable sellar mass. 3. No acute intracranial abnormality. Electronically Signed   By: Aram Candela M.D.   On: 08/10/2023 00:11     PROCEDURES:  Critical Care performed: No  Procedures   MEDICATIONS ORDERED IN ED: Medications  bacitracin ointment (has no administration in time range)     IMPRESSION / MDM / ASSESSMENT AND PLAN / ED COURSE  I reviewed the triage vital signs and the nursing notes.                               Differential diagnosis includes, but is not limited to, can tear, abrasion, closed head injury, SDH  Patient's presentation is most consistent with acute complicated illness / injury requiring diagnostic workup.  Patient with a history of A-fib on Eliquis and prior CVA, presents to the ED following mechanical fall.  His primary complaint is left arm abrasion and skin tear.  Patient reports to drinking prior to walking the dog and the mechanical fall.  As such, he was submitted for head CT given his anticoagulation therapy as well as his EtOH on board.  Patient Sam overall reassuring at this time.  The skin tear is cleansed and dressed with antibiotic ointment, Vaseline gauze, Telfa, and stockinette.  Patient's head CT is negative for any acute intracranial process.  Patient's diagnosis is consistent with mechanical fall resulting in skin tear with head injury precaution given anticoagulation therapy and EtOH. Patient will be discharged home with instructions to take OTC Tylenol as needed.  Also given wound care instructions and supplies.  Patient is to follow up with primary provider as discussed, as needed or otherwise directed. Patient is given ED precautions to return to the ED for any worsening or new symptoms.   FINAL CLINICAL IMPRESSION(S) / ED DIAGNOSES   Final diagnoses:  Fall in home, initial encounter  Skin tear of forearm without complication, left, initial encounter     Rx / DC Orders   ED Discharge Orders     None        Note:  This document was prepared using Dragon voice recognition software and may include unintentional dictation errors.    Lissa Hoard, PA-C 08/10/23 0015    Willy Eddy, MD 08/16/23 [phone]

## 2024-03-07 ENCOUNTER — Other Ambulatory Visit: Payer: Self-pay | Admitting: Student

## 2024-03-07 DIAGNOSIS — R0602 Shortness of breath: Secondary | ICD-10-CM

## 2024-03-28 ENCOUNTER — Encounter
Admission: RE | Admit: 2024-03-28 | Discharge: 2024-03-28 | Disposition: A | Discharge status: Home / Self Care | Source: Ambulatory Visit | Attending: Student | Admitting: Student

## 2024-03-28 DIAGNOSIS — R0602 Shortness of breath: Secondary | ICD-10-CM | POA: Diagnosis present

## 2024-03-28 MED ORDER — REGADENOSON 0.4 MG/5ML IV SOLN
0.4000 mg | Freq: Once | INTRAVENOUS | Status: AC
  Administered 2024-03-28: 0.4 mg via INTRAVENOUS

## 2024-03-28 MED ORDER — TECHNETIUM TC 99M TETROFOSMIN IV KIT
10.0100 | PACK | Freq: Once | INTRAVENOUS | Status: AC | PRN
  Administered 2024-03-28: 10.01 via INTRAVENOUS

## 2024-03-28 MED ORDER — TECHNETIUM TC 99M TETROFOSMIN IV KIT
30.1700 | PACK | Freq: Once | INTRAVENOUS | Status: AC | PRN
  Administered 2024-03-28: 30.17 via INTRAVENOUS

## 2024-04-03 LAB — NM MYOCAR MULTI W/SPECT W/WALL MOTION / EF
Base ST Depression (mm): 0 mm
Estimated workload: 1
Exercise duration (min): 1 min
Exercise duration (sec): 0 s
LV dias vol: 88 mL (ref 62–150)
LV sys vol: 33 mL
MPHR: 144 {beats}/min
Nuc Stress EF: 63 %
Peak HR: 78 {beats}/min
Percent HR: 54 %
Rest HR: 51 {beats}/min
Rest Nuclear Isotope Dose: 10 mCi
SDS: 1
SRS: 11
SSS: 3
ST Depression (mm): 0 mm
Stress Nuclear Isotope Dose: 30.2 mCi
TID: 1.2
